# Patient Record
Sex: Male | Born: 1970 | Race: Black or African American | Hispanic: No | Marital: Married | Smoking: Never smoker
Health system: Southern US, Community
[De-identification: ages and names within clinical notes are randomized; demographics above are authoritative.]

## PROBLEM LIST (undated history)

## (undated) DIAGNOSIS — I10 Essential (primary) hypertension: Secondary | ICD-10-CM

## (undated) DIAGNOSIS — J329 Chronic sinusitis, unspecified: Secondary | ICD-10-CM

## (undated) DIAGNOSIS — I499 Cardiac arrhythmia, unspecified: Secondary | ICD-10-CM

## (undated) DIAGNOSIS — E785 Hyperlipidemia, unspecified: Secondary | ICD-10-CM

## (undated) HISTORY — PX: WISDOM TOOTH EXTRACTION: SHX21

## (undated) HISTORY — PX: PILONIDAL CYST EXCISION: SHX744

## (undated) HISTORY — DX: Essential (primary) hypertension: I10

---

## 2005-09-08 HISTORY — PX: PILONIDAL CYST EXCISION: SHX744

## 2006-01-05 ENCOUNTER — Emergency Department (HOSPITAL_COMMUNITY): Admission: EM | Admit: 2006-01-05 | Discharge: 2006-01-05 | Payer: Self-pay | Admitting: Family Medicine

## 2006-06-12 ENCOUNTER — Encounter (INDEPENDENT_AMBULATORY_CARE_PROVIDER_SITE_OTHER): Payer: Self-pay | Admitting: *Deleted

## 2006-06-12 ENCOUNTER — Ambulatory Visit (HOSPITAL_BASED_OUTPATIENT_CLINIC_OR_DEPARTMENT_OTHER): Admission: RE | Admit: 2006-06-12 | Discharge: 2006-06-12 | Payer: Self-pay | Admitting: Surgery

## 2008-04-12 ENCOUNTER — Emergency Department (HOSPITAL_COMMUNITY): Admission: EM | Admit: 2008-04-12 | Discharge: 2008-04-12 | Payer: Self-pay | Admitting: Family Medicine

## 2011-01-24 NOTE — Op Note (Signed)
NAMEMORDCHE, HEDGLIN             ACCOUNT NO.:  [id number]   MEDICAL RECORD NO.:  [id number]          PATIENT TYPE:  AMB   LOCATION:  DSC                          FACILITY:  [facility]   PHYSICIAN:  Sandria Bales. Ezzard Standing, M.D.  DATE OF BIRTH:  09/01/1971   DATE OF PROCEDURE:  06/12/2006  DATE OF DISCHARGE:                                 OPERATIVE REPORT   PREOPERATIVE DIAGNOSIS:  Pilonidal cyst/abscess.   POSTOPERATIVE DIAGNOSIS:  Pilonidal cyst/abscess.   PROCEDURE:  Excision of pilonidal cyst, left open.   SURGEON:  Sandria Bales. Ezzard Standing, M.D.   FIRST ASSISTANT:  None.   ANESTHESIA:  General, in a prone position with 30 cc of 0.25% Marcaine.   COMPLICATIONS:  None.   INDICATIONS FOR PROCEDURE:  Max Spencer is a 40 year old black male, pt of  Dr. Linton Flemings, who has had a pilonidal cyst over at least a couple of  years, and noticed this has kind of flared up and then gotten better, and  flared up and gotten better, and he now comes for excision of this cyst.   The indications and potential complications of the procedure were explained  to the patient.  Potential complications include bleeding, infection and the  possibility of recurrence of the cyst.   OPERATIVE NOTE:  The patient placed in a prone position.  After a general  endotracheal anesthetic, as supervised by Dr. Sampson Goon, in a prone  position, his buttocks were taped apart.  His buttocks were painted with  Betadine solution and sterilely draped.   He had 3 or 4 puncta in the midline between his intergluteal cleft.  And he  had a small, maybe 1 to 1.5-cm abscess off the left side of the very top of  this string of puncta.   I injected methylene blue into this small abscess, and this actually showed  that the tracking went to the midline, then went off to the left side again  lower down, about 3 to 4 cm.  I excised the entire tract, making it about a  6.5-cm incision, which was about 2 to 2.5 cm wide, and excised all of  the  pilonidal cyst.  I then used Bovie electrocautery to control bleeding.  I  infiltrated about 30 cc of 0.25% Marcaine.  I then put Betadine-soaked gauze  in the wound, and sterilely dressed it with 4 x 4's and an ABD.   The patient tolerated the procedure well.  In injected again about 30 cc of  0.25% Marcaine.  The sponge and needle counts were correct at the end of the  case.      Sandria Bales. Ezzard Standing, M.D.  Electronically Signed     DHN/MEDQ  D:  06/12/2006  T:  06/13/2006  Job:  [id number]   cc:   Olene Craven, M.D.

## 2012-11-28 ENCOUNTER — Emergency Department (HOSPITAL_COMMUNITY)
Admission: EM | Admit: 2012-11-28 | Discharge: 2012-11-28 | Discharge status: Absent without leave | Payer: Self-pay | Source: Home / Self Care

## 2012-11-28 ENCOUNTER — Emergency Department (HOSPITAL_COMMUNITY)
Admission: EM | Admit: 2012-11-28 | Discharge: 2012-11-28 | Disposition: A | Discharge status: Home / Self Care | Payer: 59 | Source: Home / Self Care

## 2012-11-28 ENCOUNTER — Encounter (HOSPITAL_COMMUNITY): Payer: Self-pay | Admitting: *Deleted

## 2012-11-28 DIAGNOSIS — J329 Chronic sinusitis, unspecified: Secondary | ICD-10-CM

## 2012-11-28 MED ORDER — AMOXICILLIN-POT CLAVULANATE 875-125 MG PO TABS: 1.0000 | ORAL_TABLET | Freq: Two times a day (BID) | ORAL | Status: DC

## 2012-11-28 NOTE — ED Notes (Signed)
Patient complains of sinus pressure and congestion x 1 month with yellow to green drainage.

## 2012-11-28 NOTE — ED Provider Notes (Signed)
Medical screening examination/treatment/procedure(s) were performed by non-physician practitioner and as supervising physician I was immediately available for consultation/collaboration.  Corrie Brannen   Paulena Servais, MD 11/28/12 1614 

## 2012-11-28 NOTE — ED Provider Notes (Signed)
Max Spencer is a 42 y.o. male who presents to Urgent Care today for sinus pain and discharge present for one month. Patient is left-sided sinus pain and discharge green and yellow. He's had off and on fevers and chills. He's tried multiple over-the-counter pain medications and decongestants with only marginal effect. He's had sinus infections in the past and this is consistent with those. He feels well otherwise with no chest pain palpitations or trouble breathing or current fever.    PMH reviewed. History of sinusitis. History of hyperlipidemia History  Substance Use Topics  . Smoking status: Never Smoker   . Smokeless tobacco: Not on file  . Alcohol Use: No   ROS as above Medications reviewed. No current facility-administered medications for this encounter.   Current Outpatient Prescriptions  Medication Sig Dispense Refill  . amoxicillin-clavulanate (AUGMENTIN) 875-125 MG per tablet Take 1 tablet by mouth 2 (two) times daily.  20 tablet  0    Exam:  BP 124/91  Pulse 75  Temp(Src) 99.2 F (37.3 C) (Oral)  Resp 16  SpO2 97% Gen: Well NAD HEENT: EOMI,  MMM, erythematous nasal turbinates with some discharge. Tender palpation left maxillary sinus. Lungs: CTABL Nl WOB Heart: RRR no MRG Abd: NABS, NT, ND Exts: Non edematous BL  LE, warm and well perfused.   No results found for this or any previous visit (from the past 24 hour(s)). No results found.  Assessment and Plan: 42 y.o. male with acute sinusitis.  Plan treatment with Augmentin.  Followup with primary care provider or ENT if not improved.  Discussed warning signs or symptoms. Please see discharge instructions. Patient expresses understanding.      Rodolph Bong, MD 11/28/12 [phone]

## 2012-12-27 ENCOUNTER — Encounter: Payer: Self-pay | Admitting: Cardiovascular Disease

## 2012-12-27 ENCOUNTER — Ambulatory Visit (INDEPENDENT_AMBULATORY_CARE_PROVIDER_SITE_OTHER): Payer: 59 | Admitting: Cardiovascular Disease

## 2012-12-27 VITALS — BP 164/108 | HR 78 | Ht 71.0 in | Wt 217.2 lb

## 2012-12-27 DIAGNOSIS — R002 Palpitations: Secondary | ICD-10-CM | POA: Insufficient documentation

## 2012-12-27 DIAGNOSIS — I10 Essential (primary) hypertension: Secondary | ICD-10-CM | POA: Insufficient documentation

## 2012-12-27 NOTE — Progress Notes (Signed)
HPI:  42 year old gentleman presenting for initial cardiac evaluation. 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 These have become more frequent over the last 6 weeks. He complains of increasing symptoms related to this. He feels these at nighttime when he is resting. He's had no exertional symptoms. He's had some sinus problems and has been taking Advil sinus. He completed a course of Augmentin. He is going to see Dr. Jearld Fenton with ENT in the near future. Since his sinus troubles began, he has noted more difficulty with sleep. He quit caffeine about 6 weeks ago but this has not improved his palpitations. He complains of associated lightheadedness and chest discomfort, both transient and noticed only when he feels a "skipped beat.". He continues to exercise on a sporadic basis. He can run a few miles without exertional symptoms.  He's noted borderline elevated blood pressures when he seen his primary physician, Dr. Conservation officer, historic buildings. He has not required antihypertensive treatment in the past. His blood pressure was noted to be markedly elevated today at 164/108. 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 I repeated his blood pressure and on my evaluation it was 150/90.  Outpatient Encounter Prescriptions as of 12/27/2012  Medication Sig Dispense Refill  . rosuvastatin (CRESTOR) 20 MG tablet Take 20 mg by mouth daily.      . [DISCONTINUED] amoxicillin-clavulanate (AUGMENTIN) 875-125 MG per tablet Take 1 tablet by mouth 2 (two) times daily.  20 tablet  0   No facility-administered encounter medications on file as of 12/27/2012.    Review of patient's allergies indicates no known allergies.  History reviewed. No pertinent past medical history.  History reviewed. No pertinent past surgical history.  History    Social History  . Marital Status: Single    Spouse Name: N/A    Number of Children: N/A  . Years of Education: N/A   Occupational History  . Not on file.   Social History Main Topics  . Smoking status: Never Smoker   . Smokeless tobacco: Not on file  . Alcohol Use: No  . Drug Use: No  . Sexually Active: Not Currently   Other Topics Concern  . Not on file   Social History Narrative  . No narrative on file   Family history: The patient's father died at age 68 of unclear cause. He had pneumonia a few weeks earlier and there was some mention of a myocardial infarction but again this is not definitive. His siblings have no cardiac problems.  ROS:  General: no fevers/chills/night sweats, positive for fatigue Eyes: no blurry vision, diplopia, or amaurosis ENT: no sore throat or hearing loss Resp: no cough, wheezing, or hemoptysis CV: See history of present illness GI: no abdominal pain, nausea, vomiting, diarrhea, or constipation GU: no dysuria, frequency, or hematuria Skin: no rash Neuro: no headache, numbness, tingling, or weakness of extremities Musculoskeletal: no joint pain or swelling Heme: no bleeding, DVT, or easy bruising Endo: no polydipsia or polyuria  BP 164/108  Pulse 78  Ht 5\' 11"  (1.803 m)  Wt 98.521 kg (217 lb 3.2 oz)  BMI 30.31 kg/m2  SpO2 99%  PHYSICAL EXAM: Pt is alert and oriented, WD, WN, in no distress. HEENT: normal Neck: JVP normal. Carotid upstrokes normal without bruits. No thyromegaly. Lungs: equal expansion, clear  bilaterally CV: Apex is discrete and nondisplaced, RRR without murmur or gallop Abd: soft, NT, +BS, no bruit, no hepatosplenomegaly Back: no CVA tenderness Ext: no C/C/E        Femoral pulses 2+= without bruits        DP/PT pulses intact and = Skin: warm and dry without rash Neuro: CNII-XII intact             Strength intact = bilaterally  EKG:  Sinus rhythm 68 beats per minute, rare PVC.  ASSESSMENT AND PLAN: 1.  Symptomatic PVCs. His physical exam is unremarkable. He has modified his lifestyle with avoidance of caffeine without significant improvement. I have recommended an exercise treadmill study and an echocardiogram to evaluate for any exercise-induced arrhythmia, ischemic disease, or structural heart disease. As long as these studies are normal, could consider addition of a beta blocker. I advised him to avoid cold medicine because of pseudoephedrine likely exacerbating his high blood pressure. I have asked him to record blood pressures and bring the readings in when he sees me back next month. I will followup with him after his echo and treadmill study are completed. Depending on those findings along with his blood his blood pressure readings, will consider a low-dose beta blocker.  2. Elevated Blood Pressure. He will keep a blood pressure log and bring it in next month. As above he will avoid cold medication or anything containing pseudoephedrine. Will review when he returns next month.  Tonny Bollman 12/27/2012 5:55 PM

## 2012-12-27 NOTE — Patient Instructions (Signed)
Your physician has requested that you have an echocardiogram. Echocardiography is a painless test that uses sound waves to create images of your heart. It provides your doctor with information about the size and shape of your heart and how well your heart's chambers and valves are working. This procedure takes approximately one hour. There are no restrictions for this procedure.  Your physician has requested that you have an exercise tolerance test with PA/NP. For further information please visit [email or web address] Please also follow instruction sheet, as given.  Your physician recommends that you schedule a follow-up appointment in: 4 WEEKS with Dr Excell Seltzer  Your physician recommends that you continue on your current medications as directed. Please refer to the Current Medication list given to you today.

## 2013-01-04 ENCOUNTER — Ambulatory Visit (HOSPITAL_COMMUNITY): Payer: 59 | Attending: Cardiology | Admitting: Radiology

## 2013-01-04 DIAGNOSIS — R002 Palpitations: Secondary | ICD-10-CM | POA: Insufficient documentation

## 2013-01-04 NOTE — Progress Notes (Signed)
Echocardiogram performed.  

## 2013-01-05 ENCOUNTER — Other Ambulatory Visit: Payer: Self-pay | Admitting: *Deleted

## 2013-01-05 MED ORDER — AMLODIPINE BESYLATE 5 MG PO TABS: 5.0000 mg | ORAL_TABLET | Freq: Every day | ORAL | Status: DC

## 2013-01-05 MED ORDER — ROSUVASTATIN CALCIUM 20 MG PO TABS: 20.0000 mg | ORAL_TABLET | Freq: Every day | ORAL | Status: DC

## 2013-01-07 NOTE — Progress Notes (Signed)
This encounter was created in error - please disregard.

## 2013-01-12 ENCOUNTER — Encounter: Payer: 59 | Admitting: Physician Assistant

## 2013-01-28 ENCOUNTER — Ambulatory Visit: Payer: 59 | Admitting: Cardiovascular Disease

## 2013-02-16 ENCOUNTER — Ambulatory Visit (INDEPENDENT_AMBULATORY_CARE_PROVIDER_SITE_OTHER): Payer: 59 | Admitting: Physician Assistant

## 2013-02-16 ENCOUNTER — Encounter: Payer: Self-pay | Admitting: Cardiovascular Disease

## 2013-02-16 DIAGNOSIS — R079 Chest pain, unspecified: Secondary | ICD-10-CM

## 2013-02-16 NOTE — Progress Notes (Signed)
Exercise Treadmill Test  Pre-Exercise Testing Evaluation Rhythm: normal sinus  Rate: 67     Test  Exercise Tolerance Test Ordering MD: Tonny Bollman, MD  Interpreting MD: Jacolyn Reedy, PA-C  Unique Test No: 1  Treadmill:  1  Indication for ETT: chest pain - rule out ischemia  Contraindication to ETT: No   Stress Modality: exercise - treadmill  Cardiac Imaging Performed: non   Protocol: standard Bruce - maximal  Max BP:  185/73  Max MPHR (bpm): 179 85% MPR (bpm):  152  MPHR obtained (bpm):  176 % MPHR obtained:  98%  Reached 85% MPHR (min:sec):  11:00 Total Exercise Time (min-sec):  13:24  Workload in METS:  16.1 Borg Scale: 17  Reason ETT Terminated:  fatigue    ST Segment Analysis At Rest: normal ST segments - no evidence of significant ST depression With Exercise: no evidence of significant ST depression  Other Information Arrhythmia:  No Angina during ETT:  absent (0) Quality of ETT:  diagnostic  ETT Interpretation:  normal - no evidence of ischemia by ST analysis  Comments: Good exercise tolerance. Hypertensive response to exercise   Recommendations: F/u Dr. Excell Seltzer

## 2013-05-26 ENCOUNTER — Encounter (HOSPITAL_BASED_OUTPATIENT_CLINIC_OR_DEPARTMENT_OTHER)
Admission: RE | Admit: 2013-05-26 | Discharge: 2013-05-26 | Disposition: A | Discharge status: Home / Self Care | Payer: 59 | Source: Ambulatory Visit

## 2013-05-26 ENCOUNTER — Encounter (HOSPITAL_BASED_OUTPATIENT_CLINIC_OR_DEPARTMENT_OTHER): Payer: Self-pay | Admitting: *Deleted

## 2013-05-26 LAB — BASIC METABOLIC PANEL
CO2: 27 mEq/L (ref 19–32)
Calcium: 9.8 mg/dL (ref 8.4–10.5)
Creatinine, Ser: 1.06 mg/dL (ref 0.50–1.35)
Glucose, Bld: 89 mg/dL (ref 70–99)

## 2013-05-26 NOTE — Progress Notes (Signed)
Pt saw [facility] 4/14 for palpitations-stress and echo done-nl-did have pvc-he was started on htn meds and taken off sudafed caffiene Had ekg-to come in for bmet-all those records under another [id number]

## 2013-05-27 ENCOUNTER — Encounter (HOSPITAL_BASED_OUTPATIENT_CLINIC_OR_DEPARTMENT_OTHER): Payer: Self-pay | Admitting: Anesthesiology

## 2013-05-27 ENCOUNTER — Encounter (HOSPITAL_BASED_OUTPATIENT_CLINIC_OR_DEPARTMENT_OTHER): Payer: Self-pay | Admitting: *Deleted

## 2013-05-27 ENCOUNTER — Ambulatory Visit (HOSPITAL_BASED_OUTPATIENT_CLINIC_OR_DEPARTMENT_OTHER): Payer: 59 | Admitting: Anesthesiology

## 2013-05-27 ENCOUNTER — Encounter (HOSPITAL_BASED_OUTPATIENT_CLINIC_OR_DEPARTMENT_OTHER)
Admission: RE | Disposition: A | Discharge status: Home / Self Care | Payer: Self-pay | Source: Ambulatory Visit | Attending: Otolaryngology

## 2013-05-27 ENCOUNTER — Ambulatory Visit (HOSPITAL_BASED_OUTPATIENT_CLINIC_OR_DEPARTMENT_OTHER)
Admission: RE | Admit: 2013-05-27 | Discharge: 2013-05-27 | Disposition: A | Discharge status: Home / Self Care | Payer: 59 | Source: Ambulatory Visit | Attending: Otolaryngology | Admitting: Otolaryngology

## 2013-05-27 DIAGNOSIS — E785 Hyperlipidemia, unspecified: Secondary | ICD-10-CM | POA: Insufficient documentation

## 2013-05-27 DIAGNOSIS — Z01812 Encounter for preprocedural laboratory examination: Secondary | ICD-10-CM | POA: Insufficient documentation

## 2013-05-27 DIAGNOSIS — I10 Essential (primary) hypertension: Secondary | ICD-10-CM | POA: Insufficient documentation

## 2013-05-27 DIAGNOSIS — J329 Chronic sinusitis, unspecified: Secondary | ICD-10-CM | POA: Insufficient documentation

## 2013-05-27 HISTORY — DX: Chronic sinusitis, unspecified: J32.9

## 2013-05-27 HISTORY — DX: Cardiac arrhythmia, unspecified: I49.9

## 2013-05-27 HISTORY — DX: Essential (primary) hypertension: I10

## 2013-05-27 HISTORY — DX: Hyperlipidemia, unspecified: E78.5

## 2013-05-27 HISTORY — PX: SINUS ENDO W/FUSION: SHX777

## 2013-05-27 LAB — POCT HEMOGLOBIN-HEMACUE: Hemoglobin: 16 g/dL (ref 13.0–17.0)

## 2013-05-27 SURGERY — SINUS SURGERY, ENDOSCOPIC, USING COMPUTER-ASSISTED NAVIGATION
Anesthesia: General | Site: Nose | Wound class: Clean Contaminated

## 2013-05-27 MED ORDER — PROPOFOL 10 MG/ML IV BOLUS
INTRAVENOUS | Status: DC | PRN
  Administered 2013-05-27: 200 mg via INTRAVENOUS

## 2013-05-27 MED ORDER — OXYCODONE HCL 5 MG PO TABS: 5.0000 mg | ORAL_TABLET | Freq: Once | ORAL | Status: DC | PRN

## 2013-05-27 MED ORDER — GLYCOPYRROLATE 0.2 MG/ML IJ SOLN
INTRAMUSCULAR | Status: DC | PRN
  Administered 2013-05-27: .6 mg via INTRAVENOUS

## 2013-05-27 MED ORDER — ROCURONIUM BROMIDE 100 MG/10ML IV SOLN
INTRAVENOUS | Status: DC | PRN
  Administered 2013-05-27: 40 mg via INTRAVENOUS

## 2013-05-27 MED ORDER — ONDANSETRON HCL 4 MG/2ML IJ SOLN
INTRAMUSCULAR | Status: DC | PRN
  Administered 2013-05-27: 4 mg via INTRAVENOUS

## 2013-05-27 MED ORDER — FENTANYL CITRATE 0.05 MG/ML IJ SOLN
INTRAMUSCULAR | Status: DC | PRN
  Administered 2013-05-27: 50 ug via INTRAVENOUS
  Administered 2013-05-27: 100 ug via INTRAVENOUS
  Administered 2013-05-27: 50 ug via INTRAVENOUS

## 2013-05-27 MED ORDER — LIDOCAINE-EPINEPHRINE 1 %-1:100000 IJ SOLN
INTRAMUSCULAR | Status: DC | PRN
  Administered 2013-05-27: 2.5 mL

## 2013-05-27 MED ORDER — MIDAZOLAM HCL 5 MG/5ML IJ SOLN
INTRAMUSCULAR | Status: DC | PRN
  Administered 2013-05-27: 2 mg via INTRAVENOUS

## 2013-05-27 MED ORDER — HYDROCODONE-ACETAMINOPHEN 7.5-325 MG/15ML PO SOLN: 15.0000 mL | Freq: Four times a day (QID) | ORAL | Status: DC | PRN

## 2013-05-27 MED ORDER — LACTATED RINGERS IV SOLN
INTRAVENOUS | Status: DC
  Administered 2013-05-27 (×2): via INTRAVENOUS

## 2013-05-27 MED ORDER — MIDAZOLAM HCL 2 MG/2ML IJ SOLN: 1.0000 mg | INTRAMUSCULAR | Status: DC | PRN

## 2013-05-27 MED ORDER — OXYCODONE HCL 5 MG/5ML PO SOLN: 5.0000 mg | Freq: Once | ORAL | Status: DC | PRN

## 2013-05-27 MED ORDER — ONDANSETRON HCL 4 MG/2ML IJ SOLN: 4.0000 mg | Freq: Once | INTRAMUSCULAR | Status: DC | PRN

## 2013-05-27 MED ORDER — BACITRACIN ZINC 500 UNIT/GM EX OINT
TOPICAL_OINTMENT | CUTANEOUS | Status: DC | PRN
  Administered 2013-05-27: 1 via TOPICAL

## 2013-05-27 MED ORDER — LIDOCAINE HCL (CARDIAC) 20 MG/ML IV SOLN
INTRAVENOUS | Status: DC | PRN
  Administered 2013-05-27: 100 mg via INTRAVENOUS

## 2013-05-27 MED ORDER — NEOSTIGMINE METHYLSULFATE 1 MG/ML IJ SOLN
INTRAMUSCULAR | Status: DC | PRN
  Administered 2013-05-27: 4 mg via INTRAVENOUS

## 2013-05-27 MED ORDER — FENTANYL CITRATE 0.05 MG/ML IJ SOLN: 50.0000 ug | INTRAMUSCULAR | Status: DC | PRN

## 2013-05-27 MED ORDER — CEPHALEXIN 500 MG PO CAPS: 500.0000 mg | ORAL_CAPSULE | Freq: Three times a day (TID) | ORAL | Status: DC

## 2013-05-27 MED ORDER — DEXAMETHASONE SODIUM PHOSPHATE 4 MG/ML IJ SOLN
INTRAMUSCULAR | Status: DC | PRN
  Administered 2013-05-27: 10 mg via INTRAVENOUS

## 2013-05-27 MED ORDER — HYDROMORPHONE HCL PF 1 MG/ML IJ SOLN
0.2500 mg | INTRAMUSCULAR | Status: DC | PRN
  Administered 2013-05-27: 0.5 mg via INTRAVENOUS

## 2013-05-27 MED ORDER — OXYMETAZOLINE HCL 0.05 % NA SOLN
NASAL | Status: DC | PRN
  Administered 2013-05-27: 1 via NASAL

## 2013-05-27 SURGICAL SUPPLY — 54 items
ATTRACTOMAT 16X20 MAGNETIC DRP (DRAPES) IMPLANT
BLADE RAD40 ROTATE 4M 4 5PK (BLADE) IMPLANT
BLADE RAD60 ROTATE M4 4 5PK (BLADE) IMPLANT
BLADE ROTATE RAD 12 4 M4 (BLADE) ×1 IMPLANT
BLADE ROTATE RAD 40 4 M4 (BLADE) ×2 IMPLANT
BLADE ROTATE RAD12 5PK M4 4MM (BLADE) IMPLANT
BLADE ROTATE TRICUT 4X13 M4 (BLADE) ×2 IMPLANT
BLADE TRICUT ROTATE M4 4 5PK (BLADE) IMPLANT
BUR HS RAD FRONTAL 3 (BURR) IMPLANT
CANISTER SUC SOCK COL 7 IN (MISCELLANEOUS) ×4 IMPLANT
CANISTER SUCTION 1200CC (MISCELLANEOUS) ×3 IMPLANT
CLOTH BEACON ORANGE TIMEOUT ST (SAFETY) ×2 IMPLANT
COAGULATOR SUCT SWTCH 10FR 6 (ELECTROSURGICAL) ×1 IMPLANT
DECANTER SPIKE VIAL GLASS SM (MISCELLANEOUS) ×1 IMPLANT
DRAPE SURG 17X23 STRL (DRAPES) IMPLANT
DRESSING NASAL KENNEDY 3.5X.9 (MISCELLANEOUS) IMPLANT
DRSG NASAL KENNEDY 3.5X.9 (MISCELLANEOUS)
DRSG NASOPORE 8CM (GAUZE/BANDAGES/DRESSINGS) ×1 IMPLANT
DRSG TELFA 3X8 NADH (GAUZE/BANDAGES/DRESSINGS) IMPLANT
ELECT COATED BLADE 2.86 ST (ELECTRODE) IMPLANT
ELECT REM PT RETURN 9FT ADLT (ELECTROSURGICAL) ×2
ELECTRODE REM PT RTRN 9FT ADLT (ELECTROSURGICAL) IMPLANT
GLOVE EXAM NITRILE MD LF STRL (GLOVE) ×1 IMPLANT
GLOVE SS BIOGEL STRL SZ 7.5 (GLOVE) ×1 IMPLANT
GLOVE SUPERSENSE BIOGEL SZ 7.5 (GLOVE) ×1
GLOVE SURG SS PI 7.0 STRL IVOR (GLOVE) ×1 IMPLANT
GOWN PREVENTION PLUS XLARGE (GOWN DISPOSABLE) ×2 IMPLANT
GOWN PREVENTION PLUS XXLARGE (GOWN DISPOSABLE) ×1 IMPLANT
IV NS 1000ML (IV SOLUTION)
IV NS 1000ML BAXH (IV SOLUTION) IMPLANT
IV NS 500ML (IV SOLUTION) ×2
IV NS 500ML BAXH (IV SOLUTION) IMPLANT
NDL SPNL 25GX3.5 QUINCKE BL (NEEDLE) IMPLANT
NEEDLE 27GAX1X1/2 (NEEDLE) ×2 IMPLANT
NEEDLE SPNL 25GX3.5 QUINCKE BL (NEEDLE) IMPLANT
NS IRRIG 1000ML POUR BTL (IV SOLUTION) ×1 IMPLANT
PACK BASIN DAY SURGERY FS (CUSTOM PROCEDURE TRAY) ×2 IMPLANT
PACK ENT DAY SURGERY (CUSTOM PROCEDURE TRAY) ×2 IMPLANT
PAD DRESSING TELFA 3X8 NADH (GAUZE/BANDAGES/DRESSINGS) IMPLANT
PAD ENT ADHESIVE 25PK (MISCELLANEOUS) ×2 IMPLANT
PATTIES SURGICAL .5 X3 (DISPOSABLE) ×2 IMPLANT
PENCIL FOOT CONTROL (ELECTRODE) IMPLANT
SOLUTION ANTI FOG 6CC (MISCELLANEOUS) ×2 IMPLANT
SPONGE GAUZE 2X2 8PLY STRL LF (GAUZE/BANDAGES/DRESSINGS) ×2 IMPLANT
SPONGE SURGIFOAM ABS GEL 12-7 (HEMOSTASIS) IMPLANT
SUT CHROMIC 3 0 PS 2 (SUTURE) IMPLANT
SUT ETHILON 3 0 PS 1 (SUTURE) IMPLANT
TOWEL OR 17X24 6PK STRL BLUE (TOWEL DISPOSABLE) ×2 IMPLANT
TRACKER ENT INSTRUMENT (MISCELLANEOUS) ×2 IMPLANT
TRACKER ENT PATIENT (MISCELLANEOUS) ×2 IMPLANT
TRAY DSU PREP LF (CUSTOM PROCEDURE TRAY) ×2 IMPLANT
TUBE CONNECTING 20X1/4 (TUBING) ×1 IMPLANT
TUBING STRAIGHTSHOT EPS 5PK (TUBING) ×2 IMPLANT
YANKAUER SUCT BULB TIP NO VENT (SUCTIONS) ×2 IMPLANT

## 2013-05-27 NOTE — Transfer of Care (Signed)
Immediate Anesthesia Transfer of Care Note  Patient: Max Spencer  Procedure(s) Performed: Procedure(s): ENDOSCOPIC SINUS SURGERY WITH FUSION NAVIGATION (N/A)  Patient Location: PACU  Anesthesia Type:General  Level of Consciousness: awake  Airway & Oxygen Therapy: Patient Spontanous Breathing and Patient connected to face mask oxygen  Post-op Assessment: Report given to PACU RN and Post -op Vital signs reviewed and stable  Post vital signs: Reviewed and stable  Complications: No apparent anesthesia complications

## 2013-05-27 NOTE — Anesthesia Postprocedure Evaluation (Signed)
  Anesthesia Post-op Note  Patient: Max Spencer  Procedure(s) Performed: Procedure(s): ENDOSCOPIC SINUS SURGERY WITH FUSION NAVIGATION (N/A)  Patient Location: PACU  Anesthesia Type:General  Level of Consciousness: awake, alert  and oriented  Airway and Oxygen Therapy: Patient Spontanous Breathing and Patient connected to face mask oxygen  Post-op Pain: mild  Post-op Assessment: Post-op Vital signs reviewed  Post-op Vital Signs: Reviewed  Complications: No apparent anesthesia complications

## 2013-05-27 NOTE — Op Note (Signed)
Preop/postop diagnosis: Chronic sinusitis Procedure: Bilateral maxillary antrostomy with left stripping, total ethmoidectomy, bilateral frontal sinusotomy, Medtronics fusion computer guidance, nasal polypectomy Anesthesia: Gen. Estimated blood loss: Approximately 50 cc Indications 42 year old who's had chronic sinus disease that has failed medical therapy. CT scan shows significant mucosal thickening and polypoid material throughout his sinuses bilaterally. He was informed a risk and benefits of the procedure and options were discussed all questions are answered and consent was obtained. Operation: Patient was taken to the operating room placed in the supine position after general endotracheal tube anesthesia was placed in the supine position prepped and draped in the usual sterile manner. The Medtronic system was positioned calibrated with excellent accuracy. The oxymetazoline pledgets were placed into the nose bilaterally and then the inferior and middle turbinates were injected with 1% lidocaine with 1 100,000 epinephrine. A left side was begun using the microdebrider and fusion guidance the antrostomy was opened after removing the uncinate process up to the attachment of the middle turbinate. The maxillary sinus ostia was opened widely and there was a fairly thick mucus that was removed and required microdebrider inferiorly. There was polypoid material in the maxillary sinus that was removed the microdebrider. The ethmoid was then opened open the bulla dissection was carried from posterior to anterior with polypoid material throughout the ethmoid cavity. He uses performed with a microdebrider and both the straight and 40. The nasal frontal duct was then opened and there was thick mucus within the frontal sinus. This also done with fusion guidance. This opened up all the sinuses except for the sphenoid well which did not have disease on CT scan. Pledgets were placed. The right side was repeated in same  fashion again with similar findings except not the significant thickened mucous and polyps within the maxillary sinus. Ethmoids had polypoid material throughout. The frontal sinus and mucous and opened nicely. Pledgets were placed. The patient had polypoid degeneration on the posterior aspect of the inferior turbinates  that was actually obstructing a portion of the nasopharynx and this was microdebrider and off and then the suction cautery used to cauterize the base. Nasopor soaked in bacitracin was placed into both ethmoid cavities and nasopharynx suctioned out of all blood and debris. Patient was then awakened brought to cover stable condition counts correct

## 2013-05-27 NOTE — H&P (Signed)
Max Spencer is an 42 y.o. male.   Chief Complaint: sinusitis HPI: Hx of chronic sinusitis with failure with medical therapy.   Past Medical History  Diagnosis Date  . Chronic sinus infection   . 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 Hypertension   . Hyperlipidemia     Past Surgical History  Procedure Laterality Date  . Pilonidal cyst excision  2007  . Wisdom tooth extraction      History reviewed. No pertinent family history. Social History:  reports that he has never smoked. He does not have any smokeless tobacco history on file. He reports that he does not drink alcohol or use illicit drugs.  Allergies:  Allergies  Allergen Reactions  . Tetracyclines & Related     vertigo    Medications Prior to Admission  Medication Sig Dispense Refill  . amLODipine (NORVASC) 5 MG tablet Take 5 mg by mouth daily.      . rosuvastatin (CRESTOR) 10 MG tablet Take 10 mg by mouth daily.        Results for orders placed during the hospital encounter of 05/27/13 (from the past 48 hour(s))  BASIC METABOLIC PANEL     Status: Abnormal   Collection Time    05/26/13  2:15 PM      Result Value Range   Sodium 135  135 - 145 mEq/L   Potassium 3.9  3.5 - 5.1 mEq/L   Chloride 98  96 - 112 mEq/L   CO2 27  19 - 32 mEq/L   Glucose, Bld 89  70 - 99 mg/dL   BUN 12  6 - 23 mg/dL   Creatinine, Ser 1.61  0.50 - 1.35 mg/dL   Calcium 9.8  8.4 - 09.6 mg/dL   GFR calc non Af Amer 86 (*) >90 mL/min   GFR calc Af Amer >90  >90 mL/min   Comment: (NOTE)     The eGFR has been calculated using the CKD EPI equation.     This calculation has not been validated in all clinical situations.     eGFR's persistently <90 mL/min signify possible Chronic Kidney     Disease.   No results found.  Review of Systems  Constitutional: Negative.   HENT: Negative.   Eyes: Negative.   Respiratory: Negative.   Cardiovascular: Negative.   Skin: Negative.     Blood pressure 143/88, pulse 65, temperature  98.4 F (36.9 C), temperature source Oral, resp. rate 16, height 5\' 11"  (1.803 m), weight 98.431 kg (217 lb), SpO2 97.00%. Physical Exam  Constitutional: He appears well-nourished.  HENT:  Mouth/Throat: Oropharynx is clear and moist.  Eyes: Pupils are equal, round, and reactive to light.  Neck: Normal range of motion. Neck supple.  Cardiovascular: Normal rate.   Respiratory: Effort normal.  GI: Soft.     Assessment/Plan Chronic Sinusitis- he is ready for ESS and procedure discussed  Suzanna Obey 05/27/2013, 9:36 AM

## 2013-05-27 NOTE — Anesthesia Preprocedure Evaluation (Signed)
Anesthesia Evaluation  Patient identified by MRN, date of birth, ID band Patient awake    Reviewed: Allergy & Precautions, H&P , NPO status , Patient's Chart, lab work & pertinent test results  Airway Mallampati: I      Dental  (+) Teeth Intact and Dental Advisory Given   Pulmonary  breath sounds clear to auscultation        Cardiovascular hypertension, Pt. on medications Rhythm:Regular Rate:Normal     Neuro/Psych    GI/Hepatic   Endo/Other    Renal/GU      Musculoskeletal   Abdominal   Peds  Hematology   Anesthesia Other Findings   Reproductive/Obstetrics                           Anesthesia Physical Anesthesia Plan  ASA: II  Anesthesia Plan: General   Post-op Pain Management:    Induction: Intravenous  Airway Management Planned: Oral ETT  Additional Equipment:   Intra-op Plan:   Post-operative Plan: Extubation in OR  Informed Consent: I have reviewed the patients History and Physical, chart, labs and discussed the procedure including the risks, benefits and alternatives for the proposed anesthesia with the patient or authorized representative who has indicated his/her understanding and acceptance.   Dental advisory given  Plan Discussed with: CRNA, Anesthesiologist and Surgeon  Anesthesia Plan Comments:         Anesthesia Quick Evaluation

## 2013-05-30 ENCOUNTER — Encounter (HOSPITAL_BASED_OUTPATIENT_CLINIC_OR_DEPARTMENT_OTHER): Payer: Self-pay | Admitting: Otolaryngology

## 2013-05-30 ENCOUNTER — Encounter: Payer: Self-pay | Admitting: Cardiovascular Disease

## 2013-06-09 ENCOUNTER — Telehealth (HOSPITAL_COMMUNITY): Payer: Self-pay | Admitting: Cardiovascular Disease

## 2013-06-09 NOTE — Telephone Encounter (Signed)
Spoke with patient today to follow-up on blood pressure control. He is on amlodipine 5 mg with SBP generally over 140 mm Hg. Recommend increase amlodipine to 10 mg daily.  Tonny Bollman 06/09/2013 6:24 PM

## 2013-06-13 MED ORDER — AMLODIPINE BESYLATE 10 MG PO TABS: 10.0000 mg | ORAL_TABLET | Freq: Every day | ORAL | Status: DC

## 2013-06-13 NOTE — Telephone Encounter (Signed)
Rx sent to pharmacy   

## 2014-02-09 ENCOUNTER — Other Ambulatory Visit: Payer: Self-pay | Admitting: Cardiovascular Disease

## 2014-03-21 ENCOUNTER — Ambulatory Visit: Payer: 59 | Admitting: Cardiovascular Disease

## 2014-04-04 NOTE — Progress Notes (Signed)
   Cardiology Office Note    Date:  04/05/2014   ID:  Max Spencer, DOB 1971/07/03, MRN [id number]  PCP:  Katy ApoPOLITE,RONALD D, MD  Cardiologist:  Dr. Tonny BollmanMichael Cooper      History of Present Illness: Max Spencer is a 43 y.o. male with a hx of palpitations 2/2 PVCs, HTN.  Last seen by Dr. Tonny BollmanMichael Cooper in 12/2012.  He returns for follow up.  The patient denies chest pain, shortness of breath, syncope, orthopnea, PND or significant pedal edema.    Studies:  - Echo (4/14):  Mild LVH, EF 65%, no RWMA, normal diast fxn  - ETT (6/14):  Normal    Recent Labs: 05/26/2013: Creatinine 1.06; Potassium 3.9  05/27/2013: Hemoglobin 16.0   Wt Readings from Last 3 Encounters:  04/05/14 211 lb (95.709 kg)  05/27/13 217 lb (98.431 kg)  05/27/13 217 lb (98.431 kg)     Past Medical History  Diagnosis Date  . High blood pressure   . Chronic sinus infection   . 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 Hypertension   . Hyperlipidemia     Current Outpatient Prescriptions  Medication Sig Dispense Refill  . amLODipine (NORVASC) 10 MG tablet Take 1 tablet (10 mg total) by mouth daily.  90 tablet  3  . aspirin 81 MG tablet Take 81 mg by mouth daily.      . CRESTOR 20 MG tablet TAKE 1 TABLET BY MOUTH DAILY.  30 tablet  0   No current facility-administered medications for this visit.    Allergies:   Tetracyclines & related   Social History:  The patient  reports that he has never smoked. He does not have any smokeless tobacco history on file. He reports that he does not drink alcohol or use illicit drugs.   Family History:  The patient's family history includes Cancer in his maternal grandmother; Diabetes in his maternal grandfather; Hyperlipidemia in his father, maternal grandfather, and mother; Hypertension in his maternal grandmother; Sudden death in his father.   ROS:  Please see the history of present illness.      All other systems reviewed and negative.   PHYSICAL EXAM: VS:   BP 122/80  Pulse 55  Ht 5\' 11"  (1.803 m)  Wt 211 lb (95.709 kg)  BMI 29.44 kg/m2 Well nourished, well developed, in no acute distress HEENT: normal Neck: no JVD Cardiac:  normal S1, S2; RRR; no murmur Lungs:  clear to auscultation bilaterally, no wheezing, rhonchi or rales Abd: soft, nontender, no hepatomegaly Ext: no edema Skin: warm and dry Neuro:  CNs 2-12 intact, no focal abnormalities noted  EKG:  Sinus brady, HR 55, normal axis, no ST changes  ASSESSMENT AND PLAN:  Essential hypertension:  Controlled.  Continue current Rx.  Check CMET.  Heart palpitations:  Controlled.   Other and unspecified hyperlipidemia:  Check CMET, Lipids.  Continue statin.   Disposition:  F/u Dr. Tonny BollmanMichael Cooper in 1 year.    Signed, Brynda RimScott Josefina Rynders, PA-C, MHS 04/05/2014 10:32 AM    [facility] [address], [place], [place]  [address] Phone: [phone]) [phone]; Fax: [phone]

## 2014-04-05 ENCOUNTER — Encounter: Payer: Self-pay | Admitting: Physician Assistant

## 2014-04-05 ENCOUNTER — Ambulatory Visit (INDEPENDENT_AMBULATORY_CARE_PROVIDER_SITE_OTHER): Payer: 59 | Admitting: Physician Assistant

## 2014-04-05 VITALS — BP 122/80 | HR 55 | Ht 71.0 in | Wt 211.0 lb

## 2014-04-05 DIAGNOSIS — I10 Essential (primary) hypertension: Secondary | ICD-10-CM

## 2014-04-05 DIAGNOSIS — E785 Hyperlipidemia, unspecified: Secondary | ICD-10-CM

## 2014-04-05 DIAGNOSIS — R002 Palpitations: Secondary | ICD-10-CM

## 2014-04-05 LAB — LIPID PANEL
CHOLESTEROL: 177 mg/dL (ref 0–200)
HDL: 36.9 mg/dL — ABNORMAL LOW (ref >39.00)
NonHDL: 140.1
Total CHOL/HDL Ratio: 5
Triglycerides: 429 mg/dL — ABNORMAL HIGH (ref 0.0–149.0)
VLDL: 85.8 mg/dL — ABNORMAL HIGH (ref 0.0–40.0)

## 2014-04-05 LAB — COMPREHENSIVE METABOLIC PANEL
ALT: 23 U/L (ref 0–53)
AST: 23 U/L (ref 0–37)
Albumin: 4.2 g/dL (ref 3.5–5.2)
Alkaline Phosphatase: 70 U/L (ref 39–117)
BILIRUBIN TOTAL: 0.5 mg/dL (ref 0.2–1.2)
BUN: 12 mg/dL (ref 6–23)
CALCIUM: 9.5 mg/dL (ref 8.4–10.5)
CHLORIDE: 103 meq/L (ref 96–112)
CO2: 29 meq/L (ref 19–32)
CREATININE: 1.2 mg/dL (ref 0.4–1.5)
GFR: 84.49 mL/min (ref >60.00)
Glucose, Bld: 106 mg/dL — ABNORMAL HIGH (ref 70–99)
Potassium: 4 mEq/L (ref 3.5–5.1)
SODIUM: 138 meq/L (ref 135–145)
TOTAL PROTEIN: 7.3 g/dL (ref 6.0–8.3)

## 2014-04-05 LAB — LDL CHOLESTEROL, DIRECT: LDL DIRECT: 49 mg/dL

## 2014-04-05 MED ORDER — ROSUVASTATIN CALCIUM 20 MG PO TABS: 20.0000 mg | ORAL_TABLET | Freq: Every day | ORAL | Status: DC

## 2014-04-05 MED ORDER — AMLODIPINE BESYLATE 10 MG PO TABS: 10.0000 mg | ORAL_TABLET | Freq: Every day | ORAL | Status: DC

## 2014-04-05 NOTE — Patient Instructions (Addendum)
LAB WORK TODAY  Your physician wants you to follow-up in: 1 YEAR WITH DR. Excell SeltzerOOPER. You will receive a reminder letter in the mail two months in advance. If you don't receive a letter, please call our office to schedule the follow-up appointment.

## 2014-04-06 ENCOUNTER — Telehealth: Payer: Self-pay | Admitting: *Deleted

## 2014-04-06 DIAGNOSIS — I10 Essential (primary) hypertension: Secondary | ICD-10-CM

## 2014-04-06 DIAGNOSIS — E785 Hyperlipidemia, unspecified: Secondary | ICD-10-CM

## 2014-04-06 MED ORDER — OMEGA-3-ACID ETHYL ESTERS 1 G PO CAPS: 1.0000 g | ORAL_CAPSULE | Freq: Two times a day (BID) | ORAL | Status: DC

## 2014-04-06 NOTE — Telephone Encounter (Signed)
pt notiifed about lab results and to start lovaza due to trigs 429. FLP/LFT 10/30 pt aware of repeat lab appt date. [facility]

## 2014-07-07 ENCOUNTER — Other Ambulatory Visit: Payer: 59

## 2014-08-15 ENCOUNTER — Other Ambulatory Visit (INDEPENDENT_AMBULATORY_CARE_PROVIDER_SITE_OTHER): Payer: Managed Care, Other (non HMO) | Admitting: *Deleted

## 2014-08-15 DIAGNOSIS — E785 Hyperlipidemia, unspecified: Secondary | ICD-10-CM

## 2014-08-15 DIAGNOSIS — I10 Essential (primary) hypertension: Secondary | ICD-10-CM

## 2014-08-15 LAB — HEPATIC FUNCTION PANEL
ALBUMIN: 4.2 g/dL (ref 3.5–5.2)
ALT: 23 U/L (ref 0–53)
AST: 31 U/L (ref 0–37)
Alkaline Phosphatase: 65 U/L (ref 39–117)
Bilirubin, Direct: 0 mg/dL (ref 0.0–0.3)
TOTAL PROTEIN: 7.3 g/dL (ref 6.0–8.3)
Total Bilirubin: 0.6 mg/dL (ref 0.2–1.2)

## 2014-08-15 LAB — LDL CHOLESTEROL, DIRECT: LDL DIRECT: 96.4 mg/dL

## 2014-08-15 LAB — LIPID PANEL
CHOL/HDL RATIO: 6
CHOLESTEROL: 218 mg/dL — AB (ref 0–200)
HDL: 38.8 mg/dL — AB (ref >39.00)
NonHDL: 179.2
Triglycerides: 239 mg/dL — ABNORMAL HIGH (ref 0.0–149.0)
VLDL: 47.8 mg/dL — ABNORMAL HIGH (ref 0.0–40.0)

## 2014-08-16 ENCOUNTER — Telehealth: Payer: Self-pay | Admitting: *Deleted

## 2014-08-16 NOTE — Telephone Encounter (Signed)
pt notified about lab results with verbal understanding  

## 2015-01-05 ENCOUNTER — Encounter (HOSPITAL_COMMUNITY): Payer: Self-pay | Admitting: Emergency Medicine

## 2015-01-05 ENCOUNTER — Emergency Department (HOSPITAL_COMMUNITY)
Admission: EM | Admit: 2015-01-05 | Discharge: 2015-01-05 | Disposition: A | Discharge status: Home / Self Care | Payer: Managed Care, Other (non HMO) | Source: Home / Self Care | Attending: Family Medicine | Admitting: Family Medicine

## 2015-01-05 DIAGNOSIS — K629 Disease of anus and rectum, unspecified: Secondary | ICD-10-CM | POA: Diagnosis not present

## 2015-01-05 DIAGNOSIS — K602 Anal fissure, unspecified: Secondary | ICD-10-CM

## 2015-01-05 DIAGNOSIS — K625 Hemorrhage of anus and rectum: Secondary | ICD-10-CM | POA: Diagnosis not present

## 2015-01-05 MED ORDER — DILTIAZEM GEL 2 %: 1.0000 "application " | Freq: Two times a day (BID) | CUTANEOUS | Status: DC

## 2015-01-05 MED ORDER — POLYETHYLENE GLYCOL 3350 17 GM/SCOOP PO POWD: 17.0000 g | Freq: Every day | ORAL | Status: DC

## 2015-01-05 NOTE — ED Provider Notes (Signed)
Max Spencer is a 44 y.o. male who presents to Urgent Care today for rectal bleeding present for 2 weeks. He's had intermittent rectal bleeding occurring off and on for the last 20 years but it's been persistent for the last 2 weeks. He feels some pain with stools. He's tried some stool softeners which have not helped much. No fevers or chills nausea vomiting diarrhea or abdominal pain. He feels well otherwise.   Past Medical History  Diagnosis Date  . High blood pressure   . Chronic sinus infection   . 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 Hypertension   . Hyperlipidemia    Past Surgical History  Procedure Laterality Date  . Pilonidal cyst excision    . Pilonidal cyst excision  2007  . Wisdom tooth extraction    . Sinus endo w/fusion N/A 05/27/2013    Procedure: ENDOSCOPIC SINUS SURGERY WITH FUSION NAVIGATION;  Surgeon: Suzanna ObeyJohn Byers, MD;  Location: [facility];  Service: ENT;  Laterality: N/A;   History  Substance Use Topics  . Smoking status: Never Smoker   . Smokeless tobacco: Not on file  . Alcohol Use: No   ROS as above Medications: No current facility-administered medications for this encounter.   Current Outpatient Prescriptions  Medication Sig Dispense Refill  . amLODipine (NORVASC) 10 MG tablet Take 1 tablet (10 mg total) by mouth daily. 90 tablet 3  . aspirin 81 MG tablet Take 81 mg by mouth daily.    Marland Kitchen. diltiazem 2 % GEL Apply 1 application topically 2 (two) times daily. 30 g 2  . omega-3 acid ethyl esters (LOVAZA) 1 G capsule Take 1 capsule (1 g total) by mouth 2 (two) times daily. 180 capsule 3  . polyethylene glycol powder (GLYCOLAX/MIRALAX) powder Take 17 g by mouth daily. 850 g 1  . rosuvastatin (CRESTOR) 20 MG tablet Take 1 tablet (20 mg total) by mouth daily. 90 tablet 3   Allergies  Allergen Reactions  . Tetracyclines & Related     vertigo     Exam:  BP 153/95 mmHg  Pulse 64  Temp(Src) 99.1 F (37.3 C) (Oral)  Resp 14  SpO2  98% Gen: Well NAD HEENT: EOMI,  MMM Lungs: Normal work of breathing. CTABL Heart: RRR no MRG Abd: NABS, Soft. Nondistended, Nontender Exts: Brisk capillary refill, warm and well perfused.  Rectal exam: Normal appearing anus. Digital rectal exam without masses or lesions. Anoscope with rectal fissure present at the 6:00 position  No results found for this or any previous visit (from the past 24 hour(s)). No results found.  Assessment and Plan: 44 y.o. male with rectal fissure. Treat with diltiazem gel and MiraLAX. Follow-up with gastroenterology. Referral ordered.  Discussed warning signs or symptoms. Please see discharge instructions. Patient expresses understanding.     Rodolph BongEvan S Shaneya Taketa, MD 01/05/15 [phone]

## 2015-01-05 NOTE — ED Notes (Signed)
C/o rectal bleeding onset 2 weeks w/each stool Alert, no signs of acute distress.

## 2015-01-05 NOTE — Discharge Instructions (Signed)
Thank you for coming in today. Follow-up with gastroenterology Use diltiazem gel twice daily and take MiraLAX to produce soft stools. Take the prescription to a compounding pharmacy such as [facility] [address] or [facility] [place]   Anal Fissure, Adult An anal fissure is a small tear or crack in the skin around the anus. Bleeding from a fissure usually stops on its own within a few minutes. However, bleeding will often reoccur with each bowel movement until the crack heals.  CAUSES   Passing large, hard stools.  Frequent diarrheal stools.  Constipation.  Inflammatory bowel disease (Crohn's disease or ulcerative colitis).  Infections.  Anal sex. SYMPTOMS   Small amounts of blood seen on your stools, on toilet paper, or in the toilet after a bowel movement.  Rectal bleeding.  Painful bowel movements.  Itching or irritation around the anus. DIAGNOSIS Your caregiver will examine the anal area. An anal fissure can usually be seen with careful inspection. A rectal exam may be performed and a short tube (anoscope) may be used to examine the anal canal. TREATMENT   You may be instructed to take fiber supplements. These supplements can soften your stool to help make bowel movements easier.  Sitz baths may be recommended to help heal the tear. Do not use soap in the sitz baths.  A medicated cream or ointment may be prescribed to lessen discomfort. HOME CARE INSTRUCTIONS   Maintain a diet high in fruits, whole grains, and vegetables. Avoid constipating foods like bananas and dairy products.  Take sitz baths as directed by your caregiver.  Drink enough fluids to keep your urine clear or pale yellow.  Only take over-the-counter or prescription medicines for pain, discomfort, or fever as directed by your caregiver. Do not take aspirin as this may increase bleeding.  Do not use ointments containing numbing medications  (anesthetics) or hydrocortisone. They could slow healing. SEEK MEDICAL CARE IF:   Your fissure is not completely healed within 3 days.  You have further bleeding.  You have a fever.  You have diarrhea mixed with blood.  You have pain.  Your problem is getting worse rather than better. MAKE SURE YOU:   Understand these instructions.  Will watch your condition.  Will get help right away if you are not doing well or get worse. Document Released: 08/25/2005 Document Revised: 11/17/2011 Document Reviewed: 02/09/2011 [facility] Patient Information 2015 [place]. This information is not intended to replace advice given to you by your health care provider. Make sure you discuss any questions you have with your health care provider.

## 2015-02-08 ENCOUNTER — Encounter: Payer: Self-pay | Admitting: Physician Assistant

## 2015-02-14 ENCOUNTER — Encounter: Payer: Self-pay | Admitting: Cardiology

## 2015-03-01 ENCOUNTER — Ambulatory Visit: Payer: Managed Care, Other (non HMO) | Admitting: Physician Assistant

## 2015-04-20 ENCOUNTER — Other Ambulatory Visit: Payer: Self-pay | Admitting: Physician Assistant

## 2015-05-04 ENCOUNTER — Ambulatory Visit (INDEPENDENT_AMBULATORY_CARE_PROVIDER_SITE_OTHER): Payer: Managed Care, Other (non HMO) | Admitting: Cardiovascular Disease

## 2015-05-04 ENCOUNTER — Encounter: Payer: Self-pay | Admitting: Cardiovascular Disease

## 2015-05-04 VITALS — BP 130/82 | HR 61 | Ht 71.0 in | Wt 210.8 lb

## 2015-05-04 DIAGNOSIS — E785 Hyperlipidemia, unspecified: Secondary | ICD-10-CM

## 2015-05-04 DIAGNOSIS — I10 Essential (primary) hypertension: Secondary | ICD-10-CM | POA: Diagnosis not present

## 2015-05-04 LAB — LIPID PANEL
CHOL/HDL RATIO: 6
CHOLESTEROL: 188 mg/dL (ref 0–200)
HDL: 33.8 mg/dL — ABNORMAL LOW (ref >39.00)
NonHDL: 154.11
Triglycerides: 292 mg/dL — ABNORMAL HIGH (ref 0.0–149.0)
VLDL: 58.4 mg/dL — ABNORMAL HIGH (ref 0.0–40.0)

## 2015-05-04 LAB — HEPATIC FUNCTION PANEL
ALT: 19 U/L (ref 0–53)
AST: 21 U/L (ref 0–37)
Albumin: 4.3 g/dL (ref 3.5–5.2)
Alkaline Phosphatase: 72 U/L (ref 39–117)
BILIRUBIN DIRECT: 0.1 mg/dL (ref 0.0–0.3)
TOTAL PROTEIN: 7.5 g/dL (ref 6.0–8.3)
Total Bilirubin: 0.4 mg/dL (ref 0.2–1.2)

## 2015-05-04 LAB — LDL CHOLESTEROL, DIRECT: Direct LDL: 48 mg/dL

## 2015-05-04 LAB — CK: Total CK: 183 U/L (ref 7–232)

## 2015-05-04 NOTE — Progress Notes (Signed)
Cardiology Office Note Date:  05/04/2015   ID:  Max Spencer, DOB Nov 18, 1970, MRN [id number]  PCP:  Katy Apo, MD  Cardiologist:  Tonny Bollman, MD    Chief Complaint  Patient presents with  . Hypertension    History of Present Illness: Max Spencer is a 44 y.o. male who presents for follow-up of essential hypertension. The patient has been seen in the past for palpitations related to PVCs. He is doing well from a cardiac perspective. He denies chest pain, chest pressure, shortness of breath, lightheadedness, or leg swelling. He is physically active without exertional symptoms. He is compliant with his medications.   Past Medical History  Diagnosis Date  . High blood pressure   . Chronic sinus infection   . 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 Hypertension   . Hyperlipidemia     Past Surgical History  Procedure Laterality Date  . Pilonidal cyst excision    . Pilonidal cyst excision  2007  . Wisdom tooth extraction    . Sinus endo w/fusion N/A 05/27/2013    Procedure: ENDOSCOPIC SINUS SURGERY WITH FUSION NAVIGATION;  Surgeon: Suzanna Obey, MD;  Location: [facility];  Service: ENT;  Laterality: N/A;    Current Outpatient Prescriptions  Medication Sig Dispense Refill  . amLODipine (NORVASC) 10 MG tablet TAKE 1 TABLET BY MOUTH DAILY 90 tablet 0  . CRESTOR 20 MG tablet TAKE 1 TABLET BY MOUTH DAILY 90 tablet 0  . diltiazem 2 % GEL Apply 1 application topically 2 (two) times daily. 30 g 2  . omega-3 acid ethyl esters (LOVAZA) 1 G capsule TAKE 1 CAPSULE BY MOUTH 2 TIMES DAILY. 180 capsule 0  . polyethylene glycol powder (GLYCOLAX/MIRALAX) powder Take 17 g by mouth daily. 850 g 1   No current facility-administered medications for this visit.    Allergies:   Tetracyclines & related   Social History:  The patient  reports that he has never smoked. He does not have any smokeless tobacco history on file. He reports that he does not drink  alcohol or use illicit drugs.   Family History:  The patient's  family history includes Cancer in his maternal grandmother; Diabetes in his maternal grandfather; Hyperlipidemia in his father, maternal grandfather, and mother; Hypertension in his maternal grandmother; Sudden death in his father.    ROS:  Please see the history of present illness.  All other systems are reviewed and negative.    PHYSICAL EXAM: VS:  BP 130/82 mmHg  Pulse 61  Ht 5\' 11"  (1.803 m)  Wt 210 lb 12.8 oz (95.618 kg)  BMI 29.41 kg/m2 , BMI Body mass index is 29.41 kg/(m^2). GEN: Well nourished, well developed, in no acute distress HEENT: normal Neck: no JVD, no masses. No carotid bruits Cardiac: RRR without murmur or gallop                Respiratory:  clear to auscultation bilaterally, normal work of breathing GI: soft, nontender, nondistended, + BS MS: no deformity or atrophy Ext: no pretibial edema, pedal pulses 2+= bilaterally Skin: warm and dry, no rash Neuro:  Strength and sensation are intact Psych: euthymic mood, full affect  EKG:  EKG is ordered today. The ekg ordered today shows normal sinus rhythm 61 bpm, within normal limits.  Recent Labs: 08/15/2014: ALT 23   Lipid Panel     Component Value Date/Time   CHOL 218* 08/15/2014 1208   TRIG 239.0* 08/15/2014 1208  HDL 38.80* 08/15/2014 1208   CHOLHDL 6 08/15/2014 1208   VLDL 47.8* 08/15/2014 1208   LDLDIRECT 96.4 08/15/2014 1208      Wt Readings from Last 3 Encounters:  05/04/15 210 lb 12.8 oz (95.618 kg)  04/05/14 211 lb (95.709 kg)  05/27/13 217 lb (98.431 kg)     Cardiac Studies Reviewed: 2D Echo 01/04/13: Study Conclusions  Left ventricle: The cavity size was normal. Wall thickness was increased in a pattern of mild LVH. The estimated ejection fraction was 65%. Wall motion was normal; there were no regional wall motion abnormalities. Left ventricular diastolic function parameters were normal.  ASSESSMENT AND PLAN: Essential  hypertension: The patient is doing well and his blood pressure appears to be well controlled. He has no overt symptoms. Recommend that he continue amlodipine 10 mg daily. Lipids have been followed by his primary physician but he requests that we do his lab work today. Will arrange. I will see him back in one year for follow-up.   Current medicines are reviewed with the patient today.  The patient does not have concerns regarding medicines.  Labs/ tests ordered today include:   Orders Placed This Encounter  Procedures  . Lipid panel  . Hepatic function panel  . CK (Creatine Kinase)  . EKG 12-Lead    Disposition:   FU one year  Signed, Tonny Bollman, MD  05/04/2015 1:11 PM    [facility] [address] [place], [place], [place]  [address] Phone: [phone]; Fax: [phone]

## 2015-05-04 NOTE — Patient Instructions (Signed)
Medication Instructions:  Your physician recommends that you continue on your current medications as directed. Please refer to the Current Medication list given to you today.  Labwork: Your physician recommends that you have lab work today: LIPID, LIVER and CK  Testing/Procedures: No new orders.   Follow-Up: Your physician wants you to follow-up in: 1 YEAR with Dr Excell Seltzer.  You will receive a reminder letter in the mail two months in advance. If you don't receive a letter, please call our office to schedule the follow-up appointment.   Any Other Special Instructions Will Be Listed Below (If Applicable).

## 2015-10-18 ENCOUNTER — Other Ambulatory Visit: Payer: Self-pay | Admitting: Cardiovascular Disease

## 2015-10-18 MED FILL — ROSUVASTATIN CALCIUM 20 MG: 20 | 90 days supply | Qty: 90 | Fill #0

## 2015-10-18 MED FILL — OMEGA-3 ETHYL ESTERS 1 GM C: 1 | 90 days supply | Qty: 180 | Fill #0

## 2015-10-18 MED FILL — AMLODIPINE BESYLATE 10 MG T: 10 | 90 days supply | Qty: 90 | Fill #0

## 2016-03-31 ENCOUNTER — Encounter: Payer: Self-pay | Admitting: Cardiovascular Disease

## 2016-03-31 ENCOUNTER — Ambulatory Visit (INDEPENDENT_AMBULATORY_CARE_PROVIDER_SITE_OTHER): Payer: Managed Care, Other (non HMO) | Admitting: Cardiovascular Disease

## 2016-03-31 ENCOUNTER — Telehealth: Payer: Self-pay

## 2016-03-31 VITALS — BP 142/96 | HR 56 | Ht 71.0 in | Wt 216.0 lb

## 2016-03-31 DIAGNOSIS — I10 Essential (primary) hypertension: Secondary | ICD-10-CM | POA: Diagnosis not present

## 2016-03-31 MED ORDER — AMLODIPINE BESYLATE 5 MG PO TABS: 5.0000 mg | ORAL_TABLET | Freq: Every day | ORAL | 11 refills | Status: DC

## 2016-03-31 NOTE — Progress Notes (Signed)
Cardiology Office Note Date:  04/01/2016   ID:  ORBIE GRUPE, DOB 21-Oct-1970, MRN [id number]  PCP:  Katy Apo, MD  Cardiologist:  Tonny Bollman, MD    Chief Complaint  Patient presents with  . Follow-up    HTN     History of Present Illness: Max Spencer is a 45 y.o. male who presents for follow-up of palpitations and hypertension. He has undergone previous echo and ETT, both normal, in 2014.  The patient is doing well. He's only been taking amlodipine every other day because of low blood pressures. His home readings have been in range. He's had occasional lightheadedness and fatigue when his blood pressure is low. All of this is straightened out with every other day dosing. He denies chest pain, chest pressure, or shortness of breath. He is initially scheduled here for a preoperative assessment because of a meniscal tear in his knee. However, he is reconsidering surgery because his symptoms are improving with conservative care.   Past Medical History:  Diagnosis Date  . Chronic sinus infection   . 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 High blood pressure   . Hyperlipidemia   . Hypertension     Past Surgical History:  Procedure Laterality Date  . PILONIDAL CYST EXCISION    . PILONIDAL CYST EXCISION  2007  . SINUS ENDO W/FUSION N/A 05/27/2013   Procedure: ENDOSCOPIC SINUS SURGERY WITH FUSION NAVIGATION;  Surgeon: Suzanna Obey, MD;  Location: [facility];  Service: ENT;  Laterality: N/A;  . WISDOM TOOTH EXTRACTION      Current Outpatient Prescriptions  Medication Sig Dispense Refill  . amLODipine (NORVASC) 5 MG tablet Take 1 tablet (5 mg total) by mouth daily. 30 tablet 11  . diltiazem 2 % GEL Apply 1 application topically 2 (two) times daily. 30 g 2  . omega-3 acid ethyl esters (LOVAZA) 1 g capsule TAKE 1 CAPSULE BY MOUTH TWICE DAILY 180 capsule 3  . polyethylene glycol powder (GLYCOLAX/MIRALAX) powder Take 17 g by mouth daily. 850 g 1    . rosuvastatin (CRESTOR) 20 MG tablet TAKE 1 TABLET BY MOUTH ONCE DAILY 90 tablet 3   No current facility-administered medications for this visit.     Allergies:   Tetracyclines & related   Social History:  The patient  reports that he has never smoked. He does not have any smokeless tobacco history on file. He reports that he does not drink alcohol or use drugs.   Family History:  The patient's  family history includes Cancer in his maternal grandmother; Diabetes in his maternal grandfather; Hyperlipidemia in his father, maternal grandfather, and mother; Hypertension in his maternal grandmother; Sudden death in his father.    ROS:  Please see the history of present illness.  Otherwise, review of systems is positive for back pain, palpitations.  All other systems are reviewed and negative.    PHYSICAL EXAM: VS:  BP (!) 142/96   Pulse (!) 56   Ht 5\' 11"  (1.803 m)   Wt 98 kg (216 lb)   BMI 30.13 kg/m  , BMI Body mass index is 30.13 kg/m. GEN: Well nourished, well developed, in no acute distress  HEENT: normal  Neck: no JVD, no masses. No carotid bruits Cardiac: RRR without murmur or gallop                Respiratory:  clear to auscultation bilaterally, normal work of breathing GI: soft, nontender, nondistended, + BS MS:  no deformity or atrophy  Ext: no pretibial edema, pedal pulses 2+= bilaterally Skin: warm and dry, no rash Neuro:  Strength and sensation are intact Psych: euthymic mood, full affect  EKG:  EKG is ordered today. The ekg ordered today shows sinus bradycardia 56 bpm, otherwise within normal limits.  Recent Labs: 05/04/2015: ALT 19   Lipid Panel     Component Value Date/Time   CHOL 188 05/04/2015 1044   TRIG 292.0 (H) 05/04/2015 1044   HDL 33.80 (L) 05/04/2015 1044   CHOLHDL 6 05/04/2015 1044   VLDL 58.4 (H) 05/04/2015 1044   LDLDIRECT 48.0 05/04/2015 1044      Wt Readings from Last 3 Encounters:  03/31/16 98 kg (216 lb)  05/04/15 95.6 kg (210 lb  12.8 oz)  04/05/14 95.7 kg (211 lb)     Cardiac Studies Reviewed: Echo 01-04-2013: Left ventricle: The cavity size was normal. Wall thickness was increased in a pattern of mild LVH. The estimated ejection fraction was 65%. Wall motion was normal; there were no regional wall motion abnormalities. Left ventricular diastolic function parameters were normal.  ------------------------------------------------------------ Aortic valve:  Structurally normal valve.  Cusp separation was normal. Doppler: Transvalvular velocity was within the normal range. There was no stenosis. No regurgitation.  ------------------------------------------------------------ Aorta: Aortic root: The aortic root was normal in size.  ------------------------------------------------------------ Mitral valve:  Structurally normal valve.  Leaflet separation was normal. Doppler: Transvalvular velocity was within the normal range. There was no evidence for stenosis. No regurgitation.  ------------------------------------------------------------ Left atrium: The atrium was normal in size.  ------------------------------------------------------------ Right ventricle: The cavity size was normal. Systolic function was normal.  ------------------------------------------------------------ Pulmonic valve:  The valve appears to be grossly normal. Doppler:  No significant regurgitation.  ------------------------------------------------------------ Tricuspid valve:  Structurally normal valve.  Leaflet separation was normal. Doppler: Transvalvular velocity was within the normal range. Trivial regurgitation.  ------------------------------------------------------------ Right atrium: The atrium was at the upper limits of normal in size.  ------------------------------------------------------------ Pericardium: There was no pericardial effusion.  ETT 02-16-2013: Exercise Tolerance Test Ordering  MD: Tonny Bollman, MD  Interpreting MD: Jacolyn Reedy, PA-C  Unique Test No: 1  Treadmill:  1  Indication for ETT: chest pain - rule out ischemia  Contraindication to ETT: No   Stress Modality: exercise - treadmill  Cardiac Imaging Performed: non   Protocol: standard Bruce - maximal  Max BP:  185/73  Max MPHR (bpm): 179 85% MPR (bpm):  152  MPHR obtained (bpm):  176 % MPHR obtained:  98%  Reached 85% MPHR (min:sec):  11:00 Total Exercise Time (min-sec):  13:24  Workload in METS:  16.1 Borg Scale: 17  Reason ETT Terminated:  fatigue    ST Segment Analysis At Rest:                     normal ST segments - no evidence of significant ST depression With Exercise:           no evidence of significant ST depression  Other Information Arrhythmia:  No                     Angina during ETT:  absent (0) Quality of ETT:  diagnostic  ETT Interpretation:  normal - no evidence of ischemia by ST analysis  Comments: Good exercise tolerance. Hypertensive response to exercise   Recommendations: F/u Dr. Excell Seltzer  ASSESSMENT AND PLAN: 1.  Essential hypertension: Will change amlodipine to 5 mg daily to reduce his dose and put  him on a daily regimen. He will continue to monitor blood pressure periodically.  2. Heart palpitations: Benign pattern. No change in symptoms.  3. Preoperative evaluation: The patient is at low risk of cardiac complications related to surgery. He has no symptoms with physical exertion and can clearly achieve much greater than 4 metabolic equivalents.  Current medicines are reviewed with the patient today.  The patient does not have concerns regarding medicines.  Labs/ tests ordered today include:   Orders Placed This Encounter  Procedures  . EKG 12-Lead    Disposition:   FU one year  Signed, Tonny Bollman, MD  04/01/2016 5:58 AM    [facility] [address] [place], [place], [place]  [address] Phone: [phone]; Fax: [phone]

## 2016-03-31 NOTE — Patient Instructions (Signed)
Medication Instructions:  Your physician has recommended you make the following change in your medication:  REDUCE Amlodipine to 5mg  daily. An Rx has been sent to your pharmacy   Labwork: None ordered  Testing/Procedures: None ordered  Follow-Up: Your physician wants you to follow-up in: 1 year with Dr.Cooper  You will receive a reminder letter in the mail two months in advance. If you don't receive a letter, please call our office to schedule the follow-up appointment.   Any Other Special Instructions Will Be Listed Below (If Applicable). You have been cleared for surgery with Dr.Wainer     If you need a refill on your cardiac medications before your next appointment, please call your pharmacy.

## 2016-03-31 NOTE — Telephone Encounter (Signed)
Cardiac clearance placed in MR nurse fax box to be faxed to [facility] 

## 2016-04-11 MED FILL — ROSUVASTATIN CALCIUM 20 MG: 20 | 90 days supply | Qty: 90 | Fill #1

## 2016-04-11 MED FILL — OMEGA-3 ETHYL ESTERS 1 GM C: 1 | 90 days supply | Qty: 180 | Fill #1

## 2016-04-11 MED FILL — AMLODIPINE BESYLATE 5 MG TA: 5 | 30 days supply | Qty: 30 | Fill #0

## 2016-07-09 MED FILL — AMLODIPINE BESYLATE 5 MG TA: 5 | 30 days supply | Qty: 30 | Fill #1

## 2016-07-09 MED FILL — OMEGA-3 ETHYL ESTERS 1 GM C: 1 | 30 days supply | Qty: 60 | Fill #2

## 2016-07-09 MED FILL — ROSUVASTATIN CALCIUM 20 MG: 20 | 30 days supply | Qty: 30 | Fill #2

## 2016-08-19 DIAGNOSIS — R05 Cough: Secondary | ICD-10-CM | POA: Diagnosis not present

## 2016-08-22 DIAGNOSIS — J019 Acute sinusitis, unspecified: Secondary | ICD-10-CM | POA: Diagnosis not present

## 2016-08-22 DIAGNOSIS — J209 Acute bronchitis, unspecified: Secondary | ICD-10-CM | POA: Diagnosis not present

## 2016-08-26 ENCOUNTER — Ambulatory Visit (INDEPENDENT_AMBULATORY_CARE_PROVIDER_SITE_OTHER): Payer: BLUE CROSS/BLUE SHIELD | Admitting: Family Medicine

## 2016-08-26 ENCOUNTER — Encounter: Payer: Self-pay | Admitting: Family Medicine

## 2016-08-26 VITALS — BP 132/80 | HR 90 | Resp 12 | Ht 71.0 in | Wt 218.0 lb

## 2016-08-26 DIAGNOSIS — E785 Hyperlipidemia, unspecified: Secondary | ICD-10-CM

## 2016-08-26 DIAGNOSIS — R05 Cough: Secondary | ICD-10-CM | POA: Diagnosis not present

## 2016-08-26 DIAGNOSIS — R059 Cough, unspecified: Secondary | ICD-10-CM

## 2016-08-26 DIAGNOSIS — K219 Gastro-esophageal reflux disease without esophagitis: Secondary | ICD-10-CM

## 2016-08-26 DIAGNOSIS — I10 Essential (primary) hypertension: Secondary | ICD-10-CM

## 2016-08-26 MED ORDER — AMLODIPINE BESYLATE 5 MG PO TABS: 5.0000 mg | ORAL_TABLET | Freq: Every day | ORAL | 2 refills | Status: DC

## 2016-08-26 MED ORDER — ROSUVASTATIN CALCIUM 20 MG PO TABS: 20.0000 mg | ORAL_TABLET | Freq: Every day | ORAL | 3 refills | Status: DC

## 2016-08-26 MED ORDER — OMEPRAZOLE 40 MG PO CPDR: 40.0000 mg | DELAYED_RELEASE_CAPSULE | Freq: Every day | ORAL | 3 refills | Status: DC

## 2016-08-26 NOTE — Patient Instructions (Signed)
A few things to remember from today's visit:   Essential hypertension - Plan: amLODipine (NORVASC) 5 MG tablet, Comprehensive metabolic panel, CANCELED: Comprehensive metabolic panel  Gastroesophageal reflux disease, esophagitis presence not specified - Plan: omeprazole (PRILOSEC) 40 MG capsule  Hyperlipidemia, unspecified hyperlipidemia type - Plan: rosuvastatin (CRESTOR) 20 MG tablet, Lipid panel, Comprehensive metabolic panel, CANCELED: Lipid panel   Blood pressure goal for most people is less than 140/90.  Elevated blood pressure increases the risk of strokes, heart and kidney disease, and eye problems. Regular physical activity and a healthy diet (DASH diet) usually help. Low salt diet. Take medications as instructed. Caution with some over the counter medications as cold medications, dietary products (for weight loss), and Ibuprofen or Aleve (frequent use);all these medications could cause elevation of blood pressure.    Please be sure medication list is accurate. If a new problem present, please set up appointment sooner than planned today.

## 2016-08-26 NOTE — Progress Notes (Signed)
HPI:   [name] is a 45 y.o. male, who is here today to establish care with me.  Former PCP: Max Spencer (cardiology) Last preventive routine visit: 2013.   Concerns today: Follow up on some of his chronic medical problems and refills.  Hypertension:  2-3 years ago started on antihypertensive but it was borderline for a while.   Currently on Amlodipine 5 mg daily.    He is taking medications as instructed, no side effects reported.  He has not noted unusual headache, visual changes, exertional chest pain, dyspnea,  focal weakness, or edema.   Lab Results  Component Value Date   CREATININE 1.2 04/05/2014   BUN 12 04/05/2014   NA 138 04/05/2014   K 4.0 04/05/2014   CL 103 04/05/2014   CO2 29 04/05/2014     HLD:  He is on Crestor 20 mg daily and Lovaza 1 g bid. Tolerating medication well. He denies Hx of CVD.   He does not exercise regularly but he follows a healthy diet. 01/2016 torn left knee meniscus,so not able to exercise, states that knee is feeling better.  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  -He is also reporting recent visit to Urgent care last week for "congestion.", He was started on Augmentin and Prednisone but symptoms not resolved. + Mainly non productive cough for a month, usually in the morning after getting up. + Post nasal drainage and nasal congestion.   CXR done was "clear."   + Heartburn, getting worse in the past few months. Takes Zantac OTC as needed. Sweets in general exacerbate symptoms.   Denies abdominal pain, nausea, vomiting, changes in bowel habits, blood in stool or melena. .    Review of Systems  Constitutional: Negative for activity change, appetite change, fatigue, fever and unexpected weight change.  HENT: Positive for congestion, postnasal drip and rhinorrhea. Negative for nosebleeds, sore throat and trouble swallowing.     Eyes: Negative for redness and visual disturbance.  Respiratory: Positive for cough. Negative for apnea, shortness of breath and wheezing.   Cardiovascular: Negative for chest pain, palpitations and leg swelling.  Gastrointestinal: Negative for abdominal pain, nausea and vomiting.       No changes in bowel habits.  Genitourinary: Negative for decreased urine volume, difficulty urinating and hematuria.  Musculoskeletal: Negative for myalgias.  Skin: Negative for color change and rash.  Allergic/Immunologic: Positive for environmental allergies.  Neurological: Negative for dizziness, syncope, weakness and headaches.  Psychiatric/Behavioral: Negative for confusion. The patient is not nervous/anxious.       Current Outpatient Prescriptions on File Prior to Visit  Medication Sig Dispense Refill  . omega-3 acid ethyl esters (LOVAZA) 1 g capsule TAKE 1 CAPSULE BY MOUTH TWICE DAILY 180 capsule 3  . polyethylene glycol powder (GLYCOLAX/MIRALAX) powder Take 17 g by mouth daily. 850 g 1   No current facility-administered medications on file prior to visit.      Past Medical History:  Diagnosis Date  . Chronic sinus infection   . 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 High blood pressure   . Hyperlipidemia   . Hypertension    Allergies  Allergen Reactions  . Tetracyclines & Related     vertigo    Family History  Problem Relation Age of Onset  . Cancer Maternal Grandmother   . Diabetes Maternal Grandfather   .  Hyperlipidemia Maternal Grandfather   . Hyperlipidemia Father   . Hyperlipidemia Mother   . Hypertension Maternal Grandmother   . Sudden death Father     Social History   Social History  . Marital status: Married    Spouse name: N/A  . Number of children: N/A  . Years of education: N/A   Social History Main Topics  . Smoking status: Never Smoker  . Smokeless tobacco: None  . Alcohol use No  . Drug use: No  . Sexual activity: Not Currently   Other  Topics Concern  . None   Social History Narrative   ** Merged History Encounter **       He is married. He does not smoke cigarettes or drink alcohol. His children are ages 6420, 9416, and 623. 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    Vitals:   08/26/16 1504  BP: 132/80  Pulse: 90  Resp: 12   O2 sat 98% at RA.  Body mass index is 30.4 kg/m.   Physical Exam  Nursing note and vitals reviewed. Constitutional: He is oriented to person, place, and time. He appears well-developed. No distress.  HENT:  Head: Atraumatic.  Mouth/Throat: Oropharynx is clear and moist and mucous membranes are normal.  Eyes: Conjunctivae and EOM are normal. Pupils are equal, round, and reactive to light.  Neck: No thyroid mass and no thyromegaly present.  Cardiovascular: Normal rate and regular rhythm.   No murmur heard. Pulses:      Dorsalis pedis pulses are 2+ on the right side, and 2+ on the left side.  Respiratory: Effort normal and breath sounds normal. No respiratory distress.  GI: Soft. He exhibits no mass. There is no hepatomegaly. There is no tenderness.  Musculoskeletal: He exhibits no edema.  Lymphadenopathy:    He has no cervical adenopathy.  Neurological: He is alert and oriented to person, place, and time. He has normal strength. Coordination and gait normal.  Skin: Skin is warm. No erythema.  Psychiatric: He has a normal mood and affect. Cognition and memory are normal.  Well groomed, good eye contact.      ASSESSMENT AND PLAN:     Max SporeWesley was seen today for establish care.  Diagnoses and all orders for this visit:  Essential hypertension  Adequately controlled. No changes in current management. DASH diet recommended. Eye exam recommended annually. F/U in 6 months, before if needed.  -     amLODipine (NORVASC) 5 MG tablet; Take 1 tablet (5 mg total) by mouth daily. -     Comprehensive metabolic panel; Future  Gastroesophageal  reflux disease, esophagitis presence not specified  This could be aggravating/causing cough. He is symptomatic, treatment options discussed, and side effects of PPI's. He agrees with trying Omeprazole 40 mg. GRED precautions discussed and recommended. He will let me know about treatment tolerance and symptoms through My Chart in 6-8 weeks.  -     omeprazole (PRILOSEC) 40 MG capsule; Take 1 capsule (40 mg total) by mouth daily.  Hyperlipidemia, unspecified hyperlipidemia type  No changes in current management, will follow labs done today and will give further recommendations accordingly. He is not fasting today ,so coming back tomorrow.  -     rosuvastatin (CRESTOR) 20 MG tablet; Take 1 tablet (20 mg total) by mouth daily. -     Lipid panel; Future -     Comprehensive metabolic panel; Future  Cough  We discussed possible etiologies, allergies  and GERD among some. Reporting CXR done and negative, lung auscultation today negative. GERD treatment started today.       Avishai Reihl G. [place], MD  [facility]. [place] office.

## 2016-08-26 NOTE — Progress Notes (Signed)
Pre visit review using our clinic review tool, if applicable. No additional management support is needed unless otherwise documented below in the visit note. 

## 2016-08-27 ENCOUNTER — Other Ambulatory Visit (INDEPENDENT_AMBULATORY_CARE_PROVIDER_SITE_OTHER): Payer: BLUE CROSS/BLUE SHIELD

## 2016-08-27 ENCOUNTER — Encounter: Payer: Self-pay | Admitting: Family Medicine

## 2016-08-27 DIAGNOSIS — E785 Hyperlipidemia, unspecified: Secondary | ICD-10-CM

## 2016-08-27 LAB — COMPREHENSIVE METABOLIC PANEL
ALT: 26 U/L (ref 0–53)
AST: 20 U/L (ref 0–37)
Albumin: 4.2 g/dL (ref 3.5–5.2)
Alkaline Phosphatase: 58 U/L (ref 39–117)
BILIRUBIN TOTAL: 0.5 mg/dL (ref 0.2–1.2)
BUN: 15 mg/dL (ref 6–23)
CALCIUM: 9.7 mg/dL (ref 8.4–10.5)
CHLORIDE: 101 meq/L (ref 96–112)
CO2: 32 meq/L (ref 19–32)
CREATININE: 1.17 mg/dL (ref 0.40–1.50)
GFR: 86.86 mL/min (ref >60.00)
GLUCOSE: 92 mg/dL (ref 70–99)
Potassium: 4 mEq/L (ref 3.5–5.1)
Sodium: 140 mEq/L (ref 135–145)
Total Protein: 7 g/dL (ref 6.0–8.3)

## 2016-08-27 LAB — LIPID PANEL
CHOL/HDL RATIO: 3
Cholesterol: 159 mg/dL (ref 0–200)
HDL: 51.8 mg/dL (ref >39.00)
LDL CALC: 79 mg/dL (ref 0–99)
NonHDL: 106.75
Triglycerides: 141 mg/dL (ref 0.0–149.0)
VLDL: 28.2 mg/dL (ref 0.0–40.0)

## 2016-09-26 ENCOUNTER — Encounter: Payer: Self-pay | Admitting: Family Medicine

## 2016-10-20 ENCOUNTER — Other Ambulatory Visit: Payer: Self-pay

## 2016-10-20 DIAGNOSIS — K219 Gastro-esophageal reflux disease without esophagitis: Secondary | ICD-10-CM

## 2016-10-20 MED ORDER — OMEPRAZOLE 40 MG PO CPDR: 40.0000 mg | DELAYED_RELEASE_CAPSULE | Freq: Every day | ORAL | 1 refills | Status: DC

## 2017-02-23 NOTE — Progress Notes (Signed)
HPI:   Max Spencer is a 46 y.o. male, who is here today to follow on some chronic medical problems.  HTN: Dx about 4 years ago. He is on Amlodipine 5 mg. Denies severe/frequent headache, visual changes, chest pain, dyspnea, palpitation, claudication, focal weakness, or edema.  Lab Results  Component Value Date   CREATININE 1.17 08/27/2016   BUN 15 08/27/2016   NA 140 08/27/2016   K 4.0 08/27/2016   CL 101 08/27/2016   CO2 32 08/27/2016    GERD: Started on Omeprazole 40 mg daily, he was c/o persistent cough last OV. Cough has improved, re-occurring when he discontinued the PPI, so he resumed it.   Denies abdominal pain, nausea, vomiting, changes in bowel habits, blood in stool or melena.   Hyperlipidemia:  Currently on Crestor 20 mg, which he decreased to q 2 days because it is causing aching of UE. He discontinued medication for a few weeks and pain resolved. Aching sensation has improved taking med less frequent but still present.  Following a low fat diet: Yes.   Lab Results  Component Value Date   CHOL 159 08/27/2016   HDL 51.80 08/27/2016   LDLCALC 79 08/27/2016   LDLDIRECT 48.0 05/04/2015   TRIG 141.0 08/27/2016   CHOLHDL 3 08/27/2016   Lipitor was not well tolerated either, he felt "fuggy" while he was taking it.  Concerns today:    3 weeks of left lateral left elbow pain, achy, 4/10.exacerbated at night when sleeping on either side, interfering worth sleep. Ibuprofen does not help. Started 1-2 days after fishing tournament, no hx of trauma. OTC band does not help either. It has improved Left handed.    Review of Systems  Constitutional: Positive for fatigue. Negative for activity change, appetite change, fever and unexpected weight change.  HENT: Negative for nosebleeds, sore throat and trouble swallowing.   Eyes: Negative for redness and visual disturbance.  Respiratory: Negative for shortness of breath and wheezing.     Cardiovascular: Negative for chest pain, palpitations and leg swelling.  Gastrointestinal: Negative for abdominal pain, nausea and vomiting.       No changes in bowel movements.  Genitourinary: Negative for decreased urine volume and hematuria.  Musculoskeletal: Positive for arthralgias and myalgias. Negative for neck pain.  Skin: Negative for color change and rash.  Neurological: Negative for syncope, weakness, numbness and headaches.  Psychiatric/Behavioral: Positive for sleep disturbance. Negative for confusion.      Current Outpatient Prescriptions on File Prior to Visit  Medication Sig Dispense Refill  . amLODipine (NORVASC) 5 MG tablet Take 1 tablet (5 mg total) by mouth daily. 90 tablet 2  . omega-3 acid ethyl esters (LOVAZA) 1 g capsule TAKE 1 CAPSULE BY MOUTH TWICE DAILY 180 capsule 3  . polyethylene glycol powder (GLYCOLAX/MIRALAX) powder Take 17 g by mouth daily. 850 g 1   No current facility-administered medications on file prior to visit.      Past Medical History:  Diagnosis Date  . Chronic sinus infection   . 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 High blood pressure   . Hyperlipidemia   . Hypertension    Allergies  Allergen Reactions  . Tetracyclines & Related     vertigo    Social History   Social History  . Marital status: Married    Spouse name: N/A  . Number of children: N/A  . Years of education: N/A   Social History Main Topics  .  Smoking status: Never Smoker  . Smokeless tobacco: Never Used  . Alcohol use No  . Drug use: No  . Sexual activity: Not Currently   Other Topics Concern  . None   Social History Narrative   ** Merged History Encounter **       He is married. He does not smoke cigarettes or drink alcohol. His children are ages 7820, 3416, and 293. 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    Vitals:   02/24/17 0954  BP: 122/80  Pulse: 62  Resp: 12  O2 sat at RA 96% Body mass  index is 30.68 kg/m.   Physical Exam  Nursing note and vitals reviewed. Constitutional: He is oriented to person, place, and time. He appears well-developed. No distress.  HENT:  Head: Atraumatic.  Mouth/Throat: Oropharynx is clear and moist and mucous membranes are normal.  Eyes: Conjunctivae and EOM are normal. Pupils are equal, round, and reactive to light.  Cardiovascular: Normal rate and regular rhythm.   No murmur heard. Pulses:      Radial pulses are 2+ on the left side.       Dorsalis pedis pulses are 2+ on the right side, and 2+ on the left side.  Respiratory: Effort normal and breath sounds normal. No respiratory distress.  GI: Soft. He exhibits no mass. There is no hepatomegaly. There is no tenderness.  Musculoskeletal: He exhibits no edema.       Left elbow: He exhibits normal range of motion, no swelling and no deformity. Tenderness (Upon palpation around lateral epicondyle. ) found.  ROM elicits pain: Flexion and extension. Pronation against resistance also causes pain.  Lymphadenopathy:    He has no cervical adenopathy.  Neurological: He is alert and oriented to person, place, and time. He has normal strength. No sensory deficit. Gait normal.  Skin: Skin is warm. No rash noted. No erythema.  Psychiatric: He has a normal mood and affect. Cognition and memory are normal.  Well groomed, good eye contact.    ASSESSMENT AND PLAN:   [name] was seen today for follow-up.  Diagnoses and all orders for this visit:  Lab Results  Component Value Date   CHOL 207 (H) 02/24/2017   HDL 28.70 (L) 02/24/2017   LDLCALC 79 08/27/2016   LDLDIRECT 39.0 02/24/2017   TRIG (H) 02/24/2017    440.0 Triglyceride is over 400; calculations on Lipids are invalid.   CHOLHDL 7 02/24/2017   Lab Results  Component Value Date   CREATININE 1.09 02/24/2017   BUN 14 02/24/2017   NA 137 02/24/2017   K 4.3 02/24/2017   CL 102 02/24/2017   CO2 31 02/24/2017    Lateral epicondylitis of  left elbow  Educated about Dx, prognosis,and treatment options. Tramadol recommended to take at night, some side effects discussed. Local ice. PT will be arranged.  -     traMADol (ULTRAM) 50 MG tablet; Take 1 tablet (50 mg total) by mouth every 12 (twelve) hours as needed. -     Ambulatory referral to Physical Therapy  Gastroesophageal reflux disease, esophagitis presence not specified  Well controlled. No changes in current management. F/U in 12 months.  -     omeprazole (PRILOSEC) 40 MG capsule; Take 1 capsule (40 mg total) by mouth daily.  Essential hypertension  Adequately controlled. No changes in current management. DASH diet recommended. Eye exam recommended annually. F/U in 6 months, before if needed.  -  Basic metabolic panel  Mixed hyperlipidemia  Discontinue Crestor. Will consider Livalo, Lopid, or Fenofibrate.. Continue low fat diet. F/U in 4-6 months.  -     Lipid panel -     LDL cholesterol, direct     -Mr. CHETT TANIGUCHI was advised to return sooner than planned today if new concerns arise.       Khanh Cordner G. [place], MD  [facility]. [place] office.

## 2017-02-24 ENCOUNTER — Encounter: Payer: Self-pay | Admitting: Family Medicine

## 2017-02-24 ENCOUNTER — Ambulatory Visit (INDEPENDENT_AMBULATORY_CARE_PROVIDER_SITE_OTHER): Payer: BLUE CROSS/BLUE SHIELD | Admitting: Family Medicine

## 2017-02-24 VITALS — BP 122/80 | HR 62 | Resp 12 | Ht 71.0 in | Wt 220.0 lb

## 2017-02-24 DIAGNOSIS — K219 Gastro-esophageal reflux disease without esophagitis: Secondary | ICD-10-CM

## 2017-02-24 DIAGNOSIS — I10 Essential (primary) hypertension: Secondary | ICD-10-CM

## 2017-02-24 DIAGNOSIS — E782 Mixed hyperlipidemia: Secondary | ICD-10-CM | POA: Diagnosis not present

## 2017-02-24 DIAGNOSIS — M7712 Lateral epicondylitis, left elbow: Secondary | ICD-10-CM

## 2017-02-24 LAB — LDL CHOLESTEROL, DIRECT: Direct LDL: 39 mg/dL

## 2017-02-24 LAB — BASIC METABOLIC PANEL
BUN: 14 mg/dL (ref 6–23)
CO2: 31 meq/L (ref 19–32)
Calcium: 9.5 mg/dL (ref 8.4–10.5)
Chloride: 102 mEq/L (ref 96–112)
Creatinine, Ser: 1.09 mg/dL (ref 0.40–1.50)
GFR: 94.04 mL/min (ref >60.00)
Glucose, Bld: 95 mg/dL (ref 70–99)
Potassium: 4.3 mEq/L (ref 3.5–5.1)
SODIUM: 137 meq/L (ref 135–145)

## 2017-02-24 LAB — LIPID PANEL
Cholesterol: 207 mg/dL — ABNORMAL HIGH (ref 0–200)
HDL: 28.7 mg/dL — ABNORMAL LOW (ref >39.00)
Total CHOL/HDL Ratio: 7
Triglycerides: 440 mg/dL — ABNORMAL HIGH (ref 0.0–149.0)

## 2017-02-24 MED ORDER — OMEPRAZOLE 40 MG PO CPDR: 40.0000 mg | DELAYED_RELEASE_CAPSULE | Freq: Every day | ORAL | 2 refills | Status: DC

## 2017-02-24 MED ORDER — TRAMADOL HCL 50 MG PO TABS: 50.0000 mg | ORAL_TABLET | Freq: Two times a day (BID) | ORAL | 0 refills | Status: AC | PRN

## 2017-02-24 NOTE — Patient Instructions (Signed)
A few things to remember from today's visit:   Essential hypertension - Plan: Basic metabolic panel  Gastroesophageal reflux disease, esophagitis presence not specified - Plan: omeprazole (PRILOSEC) 40 MG capsule  Lateral epicondylitis of left elbow - Plan: traMADol (ULTRAM) 50 MG tablet, Ambulatory referral to Physical Therapy  Mixed hyperlipidemia - Plan: Lipid panel  Local ice. Stop Crestor.  Co Q10 may help. Please be sure medication list is accurate. If a new problem present, please set up appointment sooner than planned today.

## 2017-02-28 ENCOUNTER — Encounter: Payer: Self-pay | Admitting: Family Medicine

## 2017-02-28 MED ORDER — AMLODIPINE BESYLATE 5 MG PO TABS: 5.0000 mg | ORAL_TABLET | Freq: Every day | ORAL | 2 refills | Status: DC

## 2017-02-28 MED ORDER — GEMFIBROZIL 600 MG PO TABS: 600.0000 mg | ORAL_TABLET | Freq: Two times a day (BID) | ORAL | 1 refills | Status: DC

## 2017-03-02 ENCOUNTER — Telehealth: Payer: Self-pay | Admitting: Family Medicine

## 2017-03-02 MED ORDER — ESOMEPRAZOLE MAGNESIUM 20 MG PO CPDR: DELAYED_RELEASE_CAPSULE | ORAL | 3 refills | Status: DC

## 2017-03-02 NOTE — Telephone Encounter (Signed)
Rx sent 

## 2017-03-02 NOTE — Telephone Encounter (Signed)
It is ok to change to Esomeprazole 20 mg daily before breakfast.  Thanks, BJ

## 2017-03-02 NOTE — Telephone Encounter (Signed)
Received a fax from the pharmacy.  Omeprazole is no longer covered.  Insurance will cover esomeprazole.

## 2017-03-02 NOTE — Telephone Encounter (Signed)
Okay to switch to Esomeprazole?

## 2017-03-04 ENCOUNTER — Telehealth: Payer: Self-pay | Admitting: Family Medicine

## 2017-03-04 MED ORDER — OMEPRAZOLE 20 MG PO CPDR: 20.0000 mg | DELAYED_RELEASE_CAPSULE | Freq: Every day | ORAL | 3 refills | Status: DC

## 2017-03-04 NOTE — Telephone Encounter (Signed)
Rx sent 

## 2017-03-04 NOTE — Telephone Encounter (Signed)
Okay to switch back to omeprazole or would you like to use one of the other options?

## 2017-03-04 NOTE — Telephone Encounter (Signed)
Received a fax from the pharmacy.  Esomeprazole is not covered.  Requesting alternative medication.  Pharmacy suggests pantoprazole 40 mg tab, lansoprazole 30 mg capsule, rabeprazole 20 mg tab or omeprazole 20 mg capsule.  Please advise.

## 2017-03-04 NOTE — Telephone Encounter (Signed)
If symptoms were well controlled with Omeprazole and it is covered I recommend continuing same dose. Thanks

## 2017-03-06 ENCOUNTER — Encounter: Payer: Self-pay | Admitting: Physical Therapy

## 2017-03-06 ENCOUNTER — Ambulatory Visit: Payer: BLUE CROSS/BLUE SHIELD | Attending: Family Medicine | Admitting: Physical Therapy

## 2017-03-06 DIAGNOSIS — M6281 Muscle weakness (generalized): Secondary | ICD-10-CM | POA: Insufficient documentation

## 2017-03-06 DIAGNOSIS — M25522 Pain in left elbow: Secondary | ICD-10-CM

## 2017-03-06 DIAGNOSIS — M62838 Other muscle spasm: Secondary | ICD-10-CM | POA: Insufficient documentation

## 2017-03-06 NOTE — Therapy (Signed)
[facility] [address] [place], [place], [address] Phone: [phone]   Fax:  [phone]  Physical Therapy Evaluation  Patient Details  Name: Max Spencer MRN: [id number] Date of Birth: 1971-04-23 Referring Provider: Betty G SwazilandJordan MD  Encounter Date: 03/06/2017      PT End of Session - 03/06/17 1043    Visit Number 1   Number of Visits 8   Date for PT Re-Evaluation 05/01/17   PT Start Time 0931   PT Stop Time 1016   PT Time Calculation (min) 45 min   Activity Tolerance Patient tolerated treatment well   Behavior During Therapy [facility] for tasks assessed/performed      Past Medical History:  Diagnosis Date  . Chronic sinus infection   . 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 High blood pressure   . Hyperlipidemia   . Hypertension     Past Surgical History:  Procedure Laterality Date  . PILONIDAL CYST EXCISION    . PILONIDAL CYST EXCISION  2007  . SINUS ENDO W/FUSION N/A 05/27/2013   Procedure: ENDOSCOPIC SINUS SURGERY WITH FUSION NAVIGATION;  Surgeon: Suzanna ObeyJohn Byers, MD;  Location: [facility];  Service: ENT;  Laterality: N/A;  . WISDOM TOOTH EXTRACTION      There were no vitals filed for this visit.       Subjective Assessment - 03/06/17 0936    Subjective pt is a 46 y.o m with CC of L elbow pain that started in mid to late april that occurred with non-traumtic onset, with report of exacerbation doing a fishing tournament. Since onset the pain seems to fluctuate, reportes mostly pain in the elbow and forearm. No hx or L elbow or arm problems.    Limitations Lifting   How long can you sit comfortably? unlimited   How long can you stand comfortably? unlimited   How long can you walk comfortably? unlimited   Diagnostic tests N/A   Patient Stated Goals heal it, decrease pain, know what do, return fishing, decrease with pain driving.    Currently in Pain? Yes   Pain Score 2   at worst 10/10    Pain Location Elbow   Pain Orientation Left   Pain Descriptors / Indicators Tightness;Sore   Pain Type Chronic pain   Pain Onset More than a month ago   Pain Frequency Intermittent   Aggravating Factors  reaching/ grabbing, finger movement,    Pain Relieving Factors medicine, resting/ keeping it still            [facility] - 03/06/17 0942      Assessment   Medical Diagnosis L lateral epicondlylitis   Referring Provider Betty G SwazilandJordan MD   Onset Date/Surgical Date --  Mid Apil 2018   Hand Dominance Left   Next MD Visit --  6 months   Prior Therapy no     Precautions   Precautions None     Restrictions   Weight Bearing Restrictions No     Balance Screen   Has the patient fallen in the past 6 months No   Has the patient had a decrease in activity level because of a fear of falling?  No   Is the patient reluctant to leave their home because of a fear of falling?  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   Vocation Requirements lifting/ pushing, carrying      Cognition   Overall Cognitive Status Within Functional Limits for tasks assessed     Observation/Other Assessments   Focus on Therapeutic Outcomes (FOTO)  40% limited  predicted 26% limited     Posture/Postural Control   Posture/Postural Control Postural limitations   Postural Limitations Rounded Shoulders;Forward head     ROM / Strength   AROM / PROM / Strength AROM;PROM;Strength     AROM   AROM  Assessment Site Elbow;Wrist;Forearm   Right/Left Elbow Right;Left   Right Elbow Flexion 135   Right Elbow Extension 0   Left Elbow Flexion 142   Left Elbow Extension -5  ERP achiness   Left Forearm Pronation 90 Degrees   Left Forearm Supination 90 Degrees   Right/Left Wrist Left   Right Wrist Extension 74 Degrees   Right Wrist Flexion 76 Degrees   Right Wrist Radial Deviation 32 Degrees   Right Wrist Ulnar Deviation 28 Degrees   Left Wrist Extension 72 Degrees  soreness during movement   Left Wrist Flexion 74 Degrees   Left Wrist Radial Deviation 28 Degrees   Left Wrist Ulnar Deviation 32 Degrees     PROM   PROM Assessment Site Elbow;Wrist   Right/Left Elbow Left     Strength   Strength Assessment Site Elbow;Wrist;Forearm;Hand   Right/Left Elbow Right;Left   Right Elbow Flexion 4+/5   Right Elbow Extension 4+/5   Left Elbow Flexion 4/5   Left Elbow Extension 4/5  pain during testing   Right/Left Forearm Right;Left   Right Forearm Pronation 5/5   Right Forearm Supination 5/5   Left Forearm Pronation 4-/5  pain during testing   Left Forearm Supination 4-/5   Right/Left Wrist Right;Left   Right Wrist Flexion 5/5   Right Wrist Extension 5/5   Right Wrist Radial Deviation 5/5   Right Wrist Ulnar Deviation 5/5   Left Wrist Flexion 4/5  pain during testing   Left Wrist Extension 5/5   Left Wrist Radial Deviation 4+/5  pain during testing   Left Wrist Ulnar Deviation 5/5   Right/Left hand Right;Left   Right Hand Grip (lbs) 85.6  82,89,86   Left Hand Grip (lbs) 73.3  82,69,69     Palpation   Palpation comment TTP at the lateral epicondyle, common extensor muscle, supinator on L     Special Tests    Special Tests --  cozen test (+) resisted 3rd digit extension (+)            Objective measurements completed on examination: See above findings.          [facility] Adult PT Treatment/Exercise - 03/06/17 0942      Wrist Exercises   Other wrist exercises  wrist extensor stretch 2 x 30 sec     Modalities   Modalities Iontophoresis     Iontophoresis   Type of Iontophoresis Dexamethasone   Location L lateral epicondy   Dose 1cc   Time 6 hour stat patch     Manual Therapy   Manual Therapy Soft tissue mobilization  Soft tissue mobilization IASTM over the L common extensors          Trigger Point Dry Needling - 03/06/17 1044    Consent Given? Yes   Education Handout Provided Yes   Muscles Treated Upper Body --  common extensors on the L and supinator              PT Education - 03/06/17 1042    Education provided Yes   Education Details evaluation findings, POC, goals, HEP with proper form/ rationale. anatomy of muscles and referral patterns, what TPDN is, benefits/ what to expect and after care. Ionto education  provided before and during DN treatment   Person(s) Educated Patient   Methods Explanation;Verbal cues;Handout;Demonstration   Comprehension Verbalized understanding;Verbal cues required;Returned demonstration          PT Short Term Goals - 03/06/17 1225      PT SHORT TERM GOAL #1   Title pt will be I with inital HEP (04/05/2017)   Time 4   Period Weeks   Status New     PT SHORT TERM GOAL #2   Title pt to demonstrate reduced commone extensor/supinator tightness to reduce pain to </= 1/10 and promote wrist/ elbow mobility (05/06/2017)   Time 4   Period Weeks   Status New           PT Long Term Goals - 03/06/17 1225      PT LONG TERM GOAL #1   Title increase L wrist / elbow strength to >/= 4+/5 in all planes with </= 1/10 to provide wrist/ elbow support and assist with lifting activities(05/06/2017)   Time 8   Period Weeks   Status New     PT LONG TERM GOAL #2   Title increase grip strength in L to >/= 85# with no pain during testing for lifting/ holding and manipulating items for work and ADLs (05/06/2017)   Time 8   Period Weeks   Status New     PT LONG TERM GOAL #3   Title pt will be able  to lift/ carry >/= 10# with LUE reporting </=1/10 pain in the elbow for functional work related tasks and ADLs (05/06/2017)   Time 8   Period Weeks   Status New     PT LONG TERM GOAL #4   Title increase FOTO score to </= 26% limited to demo improvement in function (05/06/2017)   Time 8   Period Weeks   Status New     PT LONG TERM GOAL #5   Title pt to be I with all HEP given as of last visit (05/06/2017)   Time 8   Period Weeks   Status New                Plan - 03/06/17 1046    Clinical Impression Statement Max Spencer presents to OPPT with CC of L elbow pain that occurred starting mid April 2018 with insidiously, pain has remained the same since onset. He demonstrates functional ROM in all planes with mild limitation with L elbow extension. weakness in all planes in the L compared R with pain during resisted extension, radial deviaion and pronation. DN was explained and performed on the common extensors and supinator on the L, and iontophoresis was applied. pt would benefit from physical therapy to decrease pain, redcue muscle tensio, improve wrist/ elbow strength, improve grip and maxmize function by addressing the deficits listed.    Clinical Presentation Stable   Clinical Decision  Making Low   Rehab Potential Good   PT Frequency 1x / week   PT Duration 8 weeks   PT Treatment/Interventions ADLs/Self Care Home Management;Cryotherapy;Electrical Stimulation;Iontophoresis 4mg /ml Dexamethasone;Moist Heat;Ultrasound;Dry needling;Taping;Manual techniques;Passive range of motion;Patient/family education;Therapeutic activities;Therapeutic exercise   PT Next Visit Plan assess/ review HEP and update PRN, assess response to DN/ ionto. soft tissue work , lateral elbow mobs with movement, continue ionto,    PT Home Exercise Plan wrist flexion / extension stretching, towel grip   Consulted and Agree with Plan of Care Patient      Patient will benefit from skilled therapeutic intervention  in order to improve the following deficits and impairments:  Pain, Improper body mechanics, Postural dysfunction, Decreased strength, Increased fascial restricitons, Decreased endurance, Decreased activity tolerance  Visit Diagnosis: Pain in left elbow - Plan: PT plan of care cert/re-cert  Muscle weakness (generalized) - Plan: PT plan of care cert/re-cert  Other muscle spasm - Plan: PT plan of care cert/re-cert     Problem List Patient Active Problem List   Diagnosis Date Noted  . GERD (gastroesophageal reflux disease) 08/26/2016  . Hyperlipidemia 04/06/2014  . Heart palpitations 12/27/2012  . Essential hypertension 12/27/2012   Lulu Riding PT, DPT, LAT, ATC  03/06/17  12:38 PM      [facility] [address] [place], [place], [address] Phone: [phone]   Fax:  [phone]  Name: Max Spencer MRN: [id number] Date of Birth: Jun 14, 1971

## 2017-03-19 ENCOUNTER — Ambulatory Visit: Payer: BLUE CROSS/BLUE SHIELD | Admitting: Physical Therapy

## 2017-03-26 ENCOUNTER — Encounter: Payer: Self-pay | Admitting: Physical Therapy

## 2017-03-26 ENCOUNTER — Ambulatory Visit: Payer: BLUE CROSS/BLUE SHIELD | Attending: Family Medicine | Admitting: Physical Therapy

## 2017-03-26 DIAGNOSIS — M62838 Other muscle spasm: Secondary | ICD-10-CM

## 2017-03-26 DIAGNOSIS — M6281 Muscle weakness (generalized): Secondary | ICD-10-CM | POA: Diagnosis not present

## 2017-03-26 DIAGNOSIS — M25522 Pain in left elbow: Secondary | ICD-10-CM | POA: Diagnosis not present

## 2017-03-26 NOTE — Therapy (Signed)
[facility] [address] [place], [place], [address] Phone: [phone]   Fax:  [phone]  Physical Therapy Treatment  Patient Details  Name: Max PlumberWesley L Ghanem MRN: [id number] Date of Birth: [birth year] Referring Provider: Betty G SwazilandJordan MD  Encounter Date: 03/26/2017      PT End of Session - 03/26/17 0932    Visit Number 2   Number of Visits 8   Date for PT Re-Evaluation 05/01/17   PT Start Time 0932   PT Stop Time 1014   PT Time Calculation (min) 42 min   Activity Tolerance Patient tolerated treatment well   Behavior During Therapy [facility] for tasks assessed/performed      Past Medical History:  Diagnosis Date  . Chronic sinus infection   . 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 High blood pressure   . Hyperlipidemia   . Hypertension     Past Surgical History:  Procedure Laterality Date  . PILONIDAL CYST EXCISION    . PILONIDAL CYST EXCISION  2007  . SINUS ENDO W/FUSION N/A 05/27/2013   Procedure: ENDOSCOPIC SINUS SURGERY WITH FUSION NAVIGATION;  Surgeon: Suzanna ObeyJohn Byers, MD;  Location: [facility];  Service: ENT;  Laterality: N/A;  . WISDOM TOOTH EXTRACTION      There were no vitals filed for this visit.      Subjective Assessment - 03/26/17 0932    Subjective Arm feels about the same after last visit. Some good days, some bad. Pain noted in reaching and grabbing from fridge.    Currently in Pain? Yes   Pain Score 6    Pain Location Elbow   Pain Orientation Left;Lateral   Pain Descriptors / Indicators Aching;Sore   Aggravating Factors  reaching/grabbing, carrying bag, moving 3/4/5th digits                         [facility] Adult PT Treatment/Exercise - 03/26/17 0001      Wrist Exercises   Other wrist exercises flexion/ext stretch on table     Manual Therapy   Manual Therapy Taping;Joint mobilization   Joint Mobilization humeral-ulnar distraction, proximal radial  mobilizations, mobilizations with movement   Soft tissue mobilization IASTM & manual STM to wrist flexors and exentsor groups   Kinesiotex Inhibit Muscle     Kinesiotix   Inhibit Muscle  L wrist extensors                PT Education - 03/26/17 1018    Education provided Yes   Education Details rationale for manual treatment, overuse injuries & options to compensations, use of roller to muscle groups, frequency of stretching rather than waiting for pain   Person(s) Educated Patient   Methods Explanation;Demonstration;Tactile cues;Verbal cues   Comprehension Verbalized understanding;Returned demonstration;Verbal cues required;Tactile cues required;Need further instruction          PT Short Term Goals - 03/06/17 1225      PT SHORT TERM GOAL #1   Title pt will be I with inital HEP (04/05/2017)   Time 4   Period Weeks   Status New     PT SHORT TERM GOAL #2   Title pt to demonstrate reduced commone extensor/supinator tightness to reduce pain to </= 1/10 and promote wrist/ elbow mobility (05/06/2017)   Time 4   Period Weeks   Status New           PT Long Term Goals - 03/06/17 1225  PT LONG TERM GOAL #1   Title increase L wrist / elbow strength to >/= 4+/5 in all planes with </= 1/10 to provide wrist/ elbow support and assist with lifting activities(05/06/2017)   Time 8   Period Weeks   Status New     PT LONG TERM GOAL #2   Title increase grip strength in L to >/= 85# with no pain during testing for lifting/ holding and manipulating items for work and ADLs (05/06/2017)   Time 8   Period Weeks   Status New     PT LONG TERM GOAL #3   Title pt will be able to lift/ carry >/= 10# with LUE reporting </=1/10 pain in the elbow for functional work related tasks and ADLs (05/06/2017)   Time 8   Period Weeks   Status New     PT LONG TERM GOAL #4   Title increase FOTO score to </= 26% limited to demo improvement in function (05/06/2017)   Time 8   Period Weeks    Status New     PT LONG TERM GOAL #5   Title pt to be I with all HEP given as of last visit (05/06/2017)   Time 8   Period Weeks   Status New             Patient will benefit from skilled therapeutic intervention in order to improve the following deficits and impairments:     Visit Diagnosis: Pain in left elbow  Muscle weakness (generalized)  Other muscle spasm     Problem List Patient Active Problem List   Diagnosis Date Noted  . GERD (gastroesophageal reflux disease) 08/26/2016  . Hyperlipidemia 04/06/2014  . Heart palpitations 12/27/2012  . Essential hypertension 12/27/2012    Max Spencer C. Max Spencer PT, DPT 03/26/17 10:21 AM   [facility] [address] [place], [place], [address] Phone: [phone]   Fax:  [phone]  Name: Max Spencer MRN: [id number] Date of Birth: 01-Feb-1971

## 2017-04-02 ENCOUNTER — Encounter: Payer: BLUE CROSS/BLUE SHIELD | Admitting: Physical Therapy

## 2017-04-09 ENCOUNTER — Encounter: Payer: Self-pay | Admitting: Physical Therapy

## 2017-04-09 ENCOUNTER — Ambulatory Visit: Payer: BLUE CROSS/BLUE SHIELD | Attending: Family Medicine | Admitting: Physical Therapy

## 2017-04-09 DIAGNOSIS — M62838 Other muscle spasm: Secondary | ICD-10-CM | POA: Insufficient documentation

## 2017-04-09 DIAGNOSIS — M6281 Muscle weakness (generalized): Secondary | ICD-10-CM | POA: Diagnosis not present

## 2017-04-09 DIAGNOSIS — M25522 Pain in left elbow: Secondary | ICD-10-CM | POA: Insufficient documentation

## 2017-04-09 NOTE — Therapy (Addendum)
[place], [place], [address] Phone: [phone]   Fax:  [phone]  Physical Therapy Treatment  Patient Details  Name: Max Spencer MRN: [id number] Date of Birth: Aug 27, 1971 Referring Provider: Betty G Martinique MD  Encounter Date: 04/09/2017      PT End of Session - 04/09/17 1027    Visit Number 3   Number of Visits 8   Date for PT Re-Evaluation 05/01/17   PT Start Time 0930   PT Stop Time 1023   PT Time Calculation (min) 53 min   Activity Tolerance Patient tolerated treatment well   Behavior During Therapy [facility] for tasks assessed/performed      Past Medical History:  Diagnosis Date  . Chronic sinus infection   . 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 High blood pressure   . Hyperlipidemia   . Hypertension     Past Surgical History:  Procedure Laterality Date  . PILONIDAL CYST EXCISION    . PILONIDAL CYST EXCISION  2007  . SINUS ENDO W/FUSION N/A 05/27/2013   Procedure: ENDOSCOPIC SINUS SURGERY WITH FUSION NAVIGATION;  Surgeon: Melissa Montane, MD;  Location: [facility];  Service: ENT;  Laterality: N/A;  . WISDOM TOOTH EXTRACTION      There were no vitals filed for this visit.      Subjective Assessment - 04/09/17 0931    Subjective "It was doing okay, some ups/ downs. I did work one night and hit it pretty good and it caused some issues"    Currently in Pain? Yes   Pain Score 0-No pain   Pain Orientation Left;Lateral   Pain Descriptors / Indicators Aching;Sore   Pain Type Chronic pain   Pain Onset More than a month ago   Pain Frequency Intermittent   Aggravating Factors  reaching/ grabbing, carrying   Pain Relieving Factors medicine, resting/ keeping it still.                          [facility] Adult PT Treatment/Exercise - 04/09/17 0949      Wrist Exercises   Other wrist exercises contract/ relax stretching for wrist extensors     Modalities    Modalities Moist Heat     Moist Heat Therapy   Number Minutes Moist Heat 10 Minutes   Moist Heat Location Elbow     Manual Therapy   Joint Mobilization humeral-ulnar distraction, proximal radial mobilizations, mobilizations with movement using belt and gripping   Soft tissue mobilization IASTM over wrist extensors and flexors   Kinesiotex Inhibit Muscle     Kinesiotix   Inhibit Muscle  L wrist extensors          Trigger Point Dry Needling - 04/09/17 0946    Consent Given? Yes   Education Handout Provided No   Muscles Treated Upper Body --  common extensors and supinator               PT Education - 04/09/17 1027    Education provided Yes   Education Details benefits of continued stretching throughout the day to promote pain relief and muscle relaxation.    Person(s) Educated Patient   Methods Explanation;Verbal cues   Comprehension Verbalized understanding;Verbal cues required          PT Short Term Goals - 04/09/17 1044      PT SHORT TERM GOAL #1   Title pt will be I with inital  HEP (04/05/2017)   Time 4   Period Weeks   Status Achieved           PT Long Term Goals - 03/06/17 1225      PT LONG TERM GOAL #1   Title increase L wrist / elbow strength to >/= 4+/5 in all planes with </= 1/10 to provide wrist/ elbow support and assist with lifting activities(05/06/2017)   Time 8   Period Weeks   Status New     PT LONG TERM GOAL #2   Title increase grip strength in L to >/= 85# with no pain during testing for lifting/ holding and manipulating items for work and ADLs (05/06/2017)   Time 8   Period Weeks   Status New     PT LONG TERM GOAL #3   Title pt will be able to lift/ carry >/= 10# with LUE reporting </=1/10 pain in the elbow for functional work related tasks and ADLs (05/06/2017)   Time 8   Period Weeks   Status New     PT LONG TERM GOAL #4   Title increase FOTO score to </= 26% limited to demo improvement in function (05/06/2017)   Time 8    Period Weeks   Status New     PT LONG TERM GOAL #5   Title pt to be I with all HEP given as of last visit (05/06/2017)   Time 8   Period Weeks   Status New               Plan - 04/09/17 1034    Clinical Impression Statement pt reported some relief of pain in the elbow since the last session. continued DN over the common extensor and supinator on the L. continued soft tissue technique folloing DN, continued elbows mobs and mobs with movement which he reported decreeased pain and tightness. continued KT taping to inhibit wrist extensors. MHP post session.    PT Next Visit Plan assess/ review HEP and update PRN, assess response to DN/ ionto. soft tissue work , lateral elbow mobs with movement, continue ionto PRN   PT Home Exercise Plan wrist flexion / extension stretching, towel grip   Consulted and Agree with Plan of Care Patient      Patient will benefit from skilled therapeutic intervention in order to improve the following deficits and impairments:  Pain, Improper body mechanics, Postural dysfunction, Decreased strength, Increased fascial restricitons, Decreased endurance, Decreased activity tolerance  Visit Diagnosis: Pain in left elbow  Muscle weakness (generalized)  Other muscle spasm     Problem List Patient Active Problem List   Diagnosis Date Noted  . GERD (gastroesophageal reflux disease) 08/26/2016  . Hyperlipidemia 04/06/2014  . Heart palpitations 12/27/2012  . Essential hypertension 12/27/2012   Starr Lake PT, DPT, LAT, ATC  04/09/17  11:00 AM      [facility] [address] [place], [place], [address] Phone: [phone]   Fax:  [phone]  Name: Max Spencer MRN: [id number] Date of Birth: [birth year]    PHYSICAL THERAPY DISCHARGE SUMMARY  Visits from Start of Care: 3  Current functional level related to goals / functional outcomes: See goals   Remaining deficits: unknown    Education / Equipment: HEP  Plan: Patient agrees to discharge.  Patient goals were not met. Patient is being discharged due to not returning since the last visit.  ?????     Chisom Aust PT, DPT, LAT, ATC  05/14/17  9:24  AM

## 2017-05-28 ENCOUNTER — Encounter: Payer: Self-pay | Admitting: Family Medicine

## 2017-05-28 ENCOUNTER — Encounter: Payer: Self-pay | Admitting: Cardiovascular Disease

## 2017-05-28 ENCOUNTER — Ambulatory Visit (INDEPENDENT_AMBULATORY_CARE_PROVIDER_SITE_OTHER): Payer: BLUE CROSS/BLUE SHIELD | Admitting: Cardiovascular Disease

## 2017-05-28 VITALS — BP 130/90 | HR 80 | Ht 71.75 in | Wt 216.0 lb

## 2017-05-28 DIAGNOSIS — I10 Essential (primary) hypertension: Secondary | ICD-10-CM

## 2017-05-28 DIAGNOSIS — E785 Hyperlipidemia, unspecified: Secondary | ICD-10-CM | POA: Diagnosis not present

## 2017-05-28 MED ORDER — OMEGA-3-ACID ETHYL ESTERS 1 G PO CAPS: 1.0000 | ORAL_CAPSULE | Freq: Two times a day (BID) | ORAL | 3 refills | Status: DC

## 2017-05-28 NOTE — Patient Instructions (Signed)
Medication Instructions:  1) LOVAZA 1g TWICE DAILY has been called in for you.  Labwork: Have your PCP follow up on fasting labs in a few months.  Testing/Procedures: None  Follow-Up: Your provider wants you to follow-up in: 1 year with Dr. Excell Seltzer. You will receive a reminder letter in the mail two months in advance. If you don't receive a letter, please call our office to schedule the follow-up appointment.    Any Other Special Instructions Will Be Listed Below (If Applicable).     If you need a refill on your cardiac medications before your next appointment, please call your pharmacy.

## 2017-05-28 NOTE — Progress Notes (Signed)
Cardiology Office Note Date:  05/28/2017   ID:  Max Spencer, Max Spencer 09-10-1970, MRN [id number]  PCP:  [place], Betty G, MD  Cardiologist:  Max Bollman, MD    Chief Complaint  Patient presents with  . Follow-up    1 year     History of Present Illness: Max Spencer is a 46 y.o. male who presents for Follow-up of hypertension and heart palpitations. Previous testing has included an echocardiogram and exercise treadmill study, both of which were normal in 2014.  He continues to have occasional symptomatic PVC's. Otherwise doing well. He swims for exercise. Had one episode when he pushed it hard and hadn't eaten before swimming. He swam 1800 meters and felt poorly when he was done. He complained of nausea and weakness, then recovered and felt ok. No CP, dyspnea, or other complaints.    Past Medical History:  Diagnosis Date  . Chronic sinus infection   . 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 High blood pressure   . Hyperlipidemia   . Hypertension     Past Surgical History:  Procedure Laterality Date  . PILONIDAL CYST EXCISION    . PILONIDAL CYST EXCISION  2007  . SINUS ENDO W/FUSION N/A 05/27/2013   Procedure: ENDOSCOPIC SINUS SURGERY WITH FUSION NAVIGATION;  Surgeon: Suzanna Obey, MD;  Location: [facility];  Service: ENT;  Laterality: N/A;  . WISDOM TOOTH EXTRACTION      Current Outpatient Prescriptions  Medication Sig Dispense Refill  . amLODipine (NORVASC) 5 MG tablet Take 5 mg by mouth every other day.    . omega-3 acid ethyl esters (LOVAZA) 1 Spencer capsule Take 1 capsule (1 Spencer total) by mouth 2 (two) times daily. 180 capsule 3  . omeprazole (PRILOSEC) 20 MG capsule Take 1 capsule (20 mg total) by mouth daily. 30 capsule 3  . rosuvastatin (CRESTOR) 20 MG tablet Take 20 mg by mouth every other day.     No current facility-administered medications for this visit.     Allergies:   Tetracyclines & related   Social History:  The patient  reports  that he has never smoked. He has never used smokeless tobacco. He reports that he does not drink alcohol or use drugs.   Family History:  The patient's family history includes Cancer in his maternal grandmother; Diabetes in his maternal grandfather; Hyperlipidemia in his father, maternal grandfather, and mother; Hypertension in his maternal grandmother; Sudden death in his father.    ROS:  Please see the history of present illness.  Otherwise, review of systems is positive for muscle pain, palpitations.  All other systems are reviewed and negative.   PHYSICAL EXAM: VS:  BP 130/90   Pulse 80   Ht 5' 11.75" (1.822 m)   Wt 98 kg (216 lb)   BMI 29.50 kg/m  , BMI Body mass index is 29.5 kg/m. GEN: Well nourished, well developed, in no acute distress  HEENT: normal  Neck: no JVD, no masses. No carotid bruits Cardiac: RRR without murmur or gallop                Respiratory:  clear to auscultation bilaterally, normal work of breathing GI: soft, nontender, nondistended, + BS MS: no deformity or atrophy  Ext: no pretibial edema, pedal pulses 2+= bilaterally Skin: warm and dry, no rash Neuro:  Strength and sensation are intact Psych: euthymic mood, full affect  EKG:  EKG is ordered today. The ekg ordered today shows  NSR 80 bpm, within normal limits  Recent Labs: 08/27/2016: ALT 26 02/24/2017: BUN 14; Creatinine, Ser 1.09; Potassium 4.3; Sodium 137   Lipid Panel     Component Value Date/Time   CHOL 207 (H) 02/24/2017 1042   TRIG (H) 02/24/2017 1042    440.0 Triglyceride is over 400; calculations on Lipids are invalid.   HDL 28.70 (L) 02/24/2017 1042   CHOLHDL 7 02/24/2017 1042   VLDL 28.2 08/27/2016 0945   LDLCALC 79 08/27/2016 0945   LDLDIRECT 39.0 02/24/2017 1042     Wt Readings from Last 3 Encounters:  05/28/17 98 kg (216 lb)  02/24/17 99.8 kg (220 lb)  08/26/16 98.9 kg (218 lb)    ASSESSMENT AND PLAN: 1.  Essential hypertension: Blood pressure is well controlled. He will  continue on his current medicines.  2. Heart palpitations: Benign pattern with no change.  3. Strong family history of premature coronary artery disease: We discussed maintenance antiplatelet therapy with low-dose aspirin but he has had hemorrhoidal bleeding problems when he is on this in the past. He will continue on a statin drug. We discussed consideration of a coronary CT calcium score which she will consider.  Current medicines are reviewed with the patient today.  The patient does not have concerns regarding medicines.  Labs/ tests ordered today include:   Orders Placed This Encounter  Procedures  . EKG 12-Lead   Disposition:   FU one year  Signed, Max Bollman, MD  05/28/2017 5:36 PM    [facility] [address] [place], [place], [place]  [address] Phone: [phone]; Fax: [phone]

## 2017-06-12 ENCOUNTER — Encounter: Payer: Self-pay | Admitting: Family Medicine

## 2017-08-25 NOTE — Progress Notes (Deleted)
Max Spencer Larrick is a 46 y.o.male, who is here today to follow on HTN and HLD.  He was last seen on 02/24/2017 due to acute visit.   Since his last visit he has follow-up with cardiologist, Dr. Excell Seltzerooper on 05/28/2017.  HTN:  Currently he is on Amlodipine 5 mg daily. ***taking medications as instructed, no side effects reported.  ***has not noted unusual headache, visual changes, exertional chest pain, dyspnea,  focal weakness, or edema.    Lab Results  Component Value Date   CREATININE 1.09 02/24/2017   BUN 14 02/24/2017   NA 137 02/24/2017   K 4.3 02/24/2017   CL 102 02/24/2017   CO2 31 02/24/2017     Hyperlipidemia:  Currently on Crestor 20 mg daily. Following a low fat diet: ***.  *** has not noted side effects with medication.  Lab Results  Component Value Date   CHOL 207 (H) 02/24/2017   HDL 28.70 (Spencer) 02/24/2017   LDLCALC 79 08/27/2016   LDLDIRECT 39.0 02/24/2017   TRIG (H) 02/24/2017    440.0 Triglyceride is over 400; calculations on Lipids are invalid.   CHOLHDL 7 02/24/2017      Review of Systems   Current Outpatient Medications on File Prior to Visit  Medication Sig Dispense Refill  . amLODipine (NORVASC) 5 MG tablet Take 5 mg by mouth every other day.    . omega-3 acid ethyl esters (LOVAZA) 1 g capsule Take 1 capsule (1 g total) by mouth 2 (two) times daily. 180 capsule 3  . omeprazole (PRILOSEC) 20 MG capsule Take 1 capsule (20 mg total) by mouth daily. 30 capsule 3  . rosuvastatin (CRESTOR) 20 MG tablet Take 20 mg by mouth every other day.     No current facility-administered medications on file prior to visit.      Past Medical History:  Diagnosis Date  . Chronic sinus infection   . 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 High blood pressure   . Hyperlipidemia   . Hypertension     Allergies  Allergen Reactions  . Tetracyclines & Related     vertigo    Social History   Socioeconomic History  . Marital status: Married     Spouse name: Not on file  . Number of children: Not on file  . Years of education: Not on file  . Highest education level: Not on file  Social Needs  . Financial resource strain: Not on file  . Food insecurity - worry: Not on file  . Food insecurity - inability: Not on file  . Transportation needs - medical: Not on file  . Transportation needs - non-medical: Not on file  Occupational History  . Not on file  Tobacco Use  . Smoking status: Never Smoker  . Smokeless tobacco: Never Used  Substance and Sexual Activity  . Alcohol use: No  . Drug use: No  . Sexual activity: Not Currently  Other Topics Concern  . Not on file  Social History Narrative   ** Merged History Encounter **       He is married. He does not smoke cigarettes or drink alcohol. His children are ages 2620, 2516, and 443. 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    There were no vitals filed for this visit. There is no height or weight on file to calculate BMI.    Physical Exam  ASSESSMENT AND PLAN:  Diagnoses and all orders for this visit:  Essential hypertension      -Max Spencer Burkel advised to return sooner than planned today if new concerns arise.     Sung Parodi G. Swazil[place]Jordan, MD  [facility]. [place] office.

## 2017-08-26 ENCOUNTER — Ambulatory Visit: Payer: BLUE CROSS/BLUE SHIELD | Admitting: Family Medicine

## 2018-01-04 DIAGNOSIS — J019 Acute sinusitis, unspecified: Secondary | ICD-10-CM | POA: Diagnosis not present

## 2018-03-22 ENCOUNTER — Other Ambulatory Visit: Payer: Self-pay | Admitting: Cardiovascular Disease

## 2018-03-22 DIAGNOSIS — E785 Hyperlipidemia, unspecified: Secondary | ICD-10-CM

## 2018-03-22 DIAGNOSIS — I10 Essential (primary) hypertension: Secondary | ICD-10-CM

## 2018-04-26 DIAGNOSIS — E781 Pure hyperglyceridemia: Secondary | ICD-10-CM | POA: Diagnosis not present

## 2018-04-26 DIAGNOSIS — I10 Essential (primary) hypertension: Secondary | ICD-10-CM | POA: Diagnosis not present

## 2018-04-26 DIAGNOSIS — E7800 Pure hypercholesterolemia, unspecified: Secondary | ICD-10-CM | POA: Diagnosis not present

## 2018-04-26 DIAGNOSIS — R61 Generalized hyperhidrosis: Secondary | ICD-10-CM | POA: Diagnosis not present

## 2018-05-11 ENCOUNTER — Other Ambulatory Visit: Payer: Self-pay | Admitting: Cardiovascular Disease

## 2018-05-11 DIAGNOSIS — R61 Generalized hyperhidrosis: Secondary | ICD-10-CM | POA: Diagnosis not present

## 2018-05-11 DIAGNOSIS — I10 Essential (primary) hypertension: Secondary | ICD-10-CM

## 2018-06-21 ENCOUNTER — Other Ambulatory Visit: Payer: Self-pay | Admitting: Cardiovascular Disease

## 2018-06-21 DIAGNOSIS — E785 Hyperlipidemia, unspecified: Secondary | ICD-10-CM

## 2018-07-23 ENCOUNTER — Ambulatory Visit: Payer: 59 | Admitting: Cardiovascular Disease

## 2018-07-23 ENCOUNTER — Encounter: Payer: Self-pay | Admitting: Cardiovascular Disease

## 2018-07-23 VITALS — BP 122/88 | HR 57 | Ht 71.75 in | Wt 212.6 lb

## 2018-07-23 DIAGNOSIS — E782 Mixed hyperlipidemia: Secondary | ICD-10-CM

## 2018-07-23 DIAGNOSIS — I10 Essential (primary) hypertension: Secondary | ICD-10-CM | POA: Diagnosis not present

## 2018-07-23 DIAGNOSIS — N522 Drug-induced erectile dysfunction: Secondary | ICD-10-CM

## 2018-07-23 LAB — COMPREHENSIVE METABOLIC PANEL
A/G RATIO: 1.7 (ref 1.2–2.2)
ALK PHOS: 71 IU/L (ref 39–117)
ALT: 26 IU/L (ref 0–44)
AST: 21 IU/L (ref 0–40)
Albumin: 4.3 g/dL (ref 3.5–5.5)
BUN/Creatinine Ratio: 10 (ref 9–20)
BUN: 11 mg/dL (ref 6–24)
Bilirubin Total: 0.4 mg/dL (ref 0.0–1.2)
CALCIUM: 9.6 mg/dL (ref 8.7–10.2)
CO2: 26 mmol/L (ref 20–29)
Chloride: 99 mmol/L (ref 96–106)
Creatinine, Ser: 1.13 mg/dL (ref 0.76–1.27)
GFR calc Af Amer: 90 mL/min/{1.73_m2} (ref >59)
GFR, EST NON AFRICAN AMERICAN: 78 mL/min/{1.73_m2} (ref >59)
GLOBULIN, TOTAL: 2.6 g/dL (ref 1.5–4.5)
Glucose: 100 mg/dL — ABNORMAL HIGH (ref 65–99)
POTASSIUM: 4.3 mmol/L (ref 3.5–5.2)
SODIUM: 139 mmol/L (ref 134–144)
Total Protein: 6.9 g/dL (ref 6.0–8.5)

## 2018-07-23 LAB — LIPID PANEL
Chol/HDL Ratio: 3.7 ratio (ref 0.0–5.0)
Cholesterol, Total: 144 mg/dL (ref 100–199)
HDL: 39 mg/dL — AB (ref >39)
LDL Calculated: 63 mg/dL (ref 0–99)
TRIGLYCERIDES: 208 mg/dL — AB (ref 0–149)
VLDL CHOLESTEROL CAL: 42 mg/dL — AB (ref 5–40)

## 2018-07-23 MED ORDER — SILDENAFIL CITRATE 20 MG PO TABS: ORAL_TABLET | ORAL | 3 refills | Status: DC

## 2018-07-23 MED ORDER — AMLODIPINE BESYLATE 10 MG PO TABS: 10.0000 mg | ORAL_TABLET | Freq: Every day | ORAL | 3 refills | Status: DC

## 2018-07-23 NOTE — Progress Notes (Signed)
Cardiology Office Note:    Date:  07/23/2018   ID:  [name], DOB 11/29/1970, MRN [id number]  PCP:  Max Bussing, MD  Cardiologist:  Tonny Bollman, MD  Electrophysiologist:  None   Referring MD: Max, Betty G, MD   Chief Complaint  Patient presents with  . Hypertension    History of Present Illness:    Max Spencer is a 47 y.o. male with a hx of hypertension and heart palpitations, presenting for follow-up evaluation.  The patient underwent echo and exercise treadmill studies in 2014, both of which were essentially normal.  The patient is here with his wife today.  Since I saw him last, he has had 2 episodes of presyncope.  Both of been when he got "overheated."  This is been worse since he has started hydrochlorothiazide.  He has been on amlodipine 5 mg for some time.  His blood pressures were increasing and consistently greater than 140 mmHg so HCTZ 12.5 mg was added.  He is had no chest pain or pressure.  He is physically active with no exertional symptoms.  He denies shortness of breath, edema, orthopnea, or PND.  He continues to have rare heart palpitations.  He has had some problems with erectile dysfunction since adding hydrochlorothiazide to amlodipine.  Past Medical History:  Diagnosis Date  . Chronic sinus infection   . 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 High blood pressure   . Hyperlipidemia   . Hypertension     Past Surgical History:  Procedure Laterality Date  . PILONIDAL CYST EXCISION    . PILONIDAL CYST EXCISION  2007  . SINUS ENDO W/FUSION N/A 05/27/2013   Procedure: ENDOSCOPIC SINUS SURGERY WITH FUSION NAVIGATION;  Surgeon: Suzanna Obey, MD;  Location: [facility];  Service: ENT;  Laterality: N/A;  . WISDOM TOOTH EXTRACTION      Current Medications: Current Meds  Medication Sig  . amLODipine (NORVASC) 10 MG tablet Take 1 tablet (10 mg total) by mouth daily.  Marland Kitchen omega-3 acid ethyl esters (LOVAZA) 1 Spencer capsule Take 1  capsule (1 Spencer total) by mouth 2 (two) times daily.  . rosuvastatin (CRESTOR) 20 MG tablet Take 1 tablet (20 mg total) by mouth daily. Please keep upcoming appt with Dr. Excell Seltzer for future refills.  . [DISCONTINUED] amLODipine (NORVASC) 5 MG tablet TAKE 1 TABLET (5 MG TOTAL) BY MOUTH EVERY OTHER DAY.  . [DISCONTINUED] hydrochlorothiazide (HYDRODIURIL) 12.5 MG tablet Take 12.5 mg by mouth daily.     Allergies:   Tetracyclines & related   Social History   Socioeconomic History  . Marital status: Married    Spouse name: Not on file  . Number of children: Not on file  . Years of education: Not on file  . Highest education level: Not on file  Occupational History  . Not on file  Social Needs  . Financial resource strain: Not on file  . Food insecurity:    Worry: Not on file    Inability: Not on file  . Transportation needs:    Medical: Not on file    Non-medical: Not on file  Tobacco Use  . Smoking status: Never Smoker  . Smokeless tobacco: Never Used  Substance and Sexual Activity  . Alcohol use: No  . Drug use: No  . Sexual activity: Not Currently  Lifestyle  . Physical activity:    Days per week: Not on file    Minutes per session: Not on file  .  Stress: Not on file  Relationships  . Social connections:    Talks on phone: Not on file    Gets together: Not on file    Attends religious service: Not on file    Active member of club or organization: Not on file    Attends meetings of clubs or organizations: Not on file    Relationship status: Not on file  Other Topics Concern  . Not on file  Social History Narrative   ** Merged History Encounter **       He is married. He does not smoke cigarettes or drink alcohol. His children are ages 74, 65, and 73. 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     Family History: The patient's family history includes Cancer in his maternal grandmother; Diabetes in his maternal grandfather;  Hyperlipidemia in his father, maternal grandfather, and mother; Hypertension in his maternal grandmother; Sudden death in his father.  ROS:   Please see the history of present illness.    All other systems reviewed and are negative.  EKGs/Labs/Other Studies Reviewed:    The following studies were reviewed today: Echocardiogram 01/04/2013: Study Conclusions  Left ventricle: The cavity size was normal. Wall thickness was increased in a pattern of mild LVH. The estimated ejection fraction was 65%. Wall motion was normal; there were no regional wall motion abnormalities. Left ventricular diastolic function parameters were normal.  EKG:  EKG is ordered today.  The ekg ordered today demonstrates sinus bradycardia 57 bpm, otherwise normal.  Recent Labs: No results found for requested labs within last 8760 hours.  Recent Lipid Panel    Component Value Date/Time   CHOL 207 (H) 02/24/2017 1042   TRIG (H) 02/24/2017 1042    440.0 Triglyceride is over 400; calculations on Lipids are invalid.   HDL 28.70 (L) 02/24/2017 1042   CHOLHDL 7 02/24/2017 1042   VLDL 28.2 08/27/2016 0945   LDLCALC 79 08/27/2016 0945   LDLDIRECT 39.0 02/24/2017 1042    Physical Exam:    VS:  BP 122/88   Pulse (!) 57   Ht 5' 11.75" (1.822 m)   Wt 212 lb 9.6 oz (96.4 kg)   SpO2 99%   BMI 29.04 kg/m     Wt Readings from Last 3 Encounters:  07/23/18 212 lb 9.6 oz (96.4 kg)  05/28/17 216 lb (98 kg)  02/24/17 220 lb (99.8 kg)     GEN:  Well nourished, well developed in no acute distress HEENT: Normal NECK: No JVD; No carotid bruits LYMPHATICS: No lymphadenopathy CARDIAC: RRR, no murmurs, rubs, gallops RESPIRATORY:  Clear to auscultation without rales, wheezing or rhonchi  ABDOMEN: Soft, non-tender, non-distended MUSCULOSKELETAL:  No edema; No deformity  SKIN: Warm and dry NEUROLOGIC:  Alert and oriented x 3 PSYCHIATRIC:  Normal affect   ASSESSMENT:    1. Mixed hyperlipidemia   2. Essential  hypertension   3. Drug-induced erectile dysfunction    PLAN:    In order of problems listed above:  1. He continues on rosuvastatin and Lovaza.  He follows a good lifestyle.  Will update his lipid panel.  We will check LFTs. 2. Blood pressure is controlled but he is experiencing side effects from HCTZ.  Will try amlodipine 10 mg and discontinuation of HCTZ.  He will continue to monitor.  Check metabolic panel today. 3. Likely related to his antihypertensive medications.  Prescription written for sildenafil. 4. The patient has a family history of premature  CAD as his father died of an MI at 46 years old.  We will check a coronary calcium CT.  He is on appropriate risk reduction with low-dose aspirin and a statin drug.  Medication Adjustments/Labs and Tests Ordered: Current medicines are reviewed at length with the patient today.  Concerns regarding medicines are outlined above.  Orders Placed This Encounter  Procedures  . CT CARDIAC SCORING  . Lipid panel  . Comprehensive metabolic panel  . EKG 12-Lead   Meds ordered this encounter  Medications  . amLODipine (NORVASC) 10 MG tablet    Sig: Take 1 tablet (10 mg total) by mouth daily.    Dispense:  90 tablet    Refill:  3  . sildenafil (REVATIO) 20 MG tablet    Sig: Take 2-5 tablets (40mg -100mg ) once daily as needed prior to sexual activity.    Dispense:  20 tablet    Refill:  3    Patient Instructions  Medication Instructions:  1) STOP HCTZ 2) INCREASE AMLODIPINE to 10 mg daily 3) START SILDENAFIL 20 mg (2-5 tablets) daily as needed prior to sexual activity  Labwork: TODAY: CMET, lipids  Testing/Procedures: Dr. Excell Seltzerooper recommends you have a CORONARY CALCIUM SCORE.  Follow-Up: Your provider wants you to follow-up in: 1 year with Dr. Excell Seltzerooper or his assistant. You will receive a reminder letter in the mail two months in advance. If you don't receive a letter, please call our office to schedule the follow-up appointment.    Any  Other Special Instructions Will Be Listed Below (If Applicable).     If you need a refill on your cardiac medications before your next appointment, please call your pharmacy.      Signed, Tonny BollmanMichael Pilar Westergaard, MD  07/23/2018 12:42 PM    [facility]

## 2018-07-23 NOTE — Patient Instructions (Signed)
Medication Instructions:  1) STOP HCTZ 2) INCREASE AMLODIPINE to 10 mg daily 3) START SILDENAFIL 20 mg (2-5 tablets) daily as needed prior to sexual activity  Labwork: TODAY: CMET, lipids  Testing/Procedures: Dr. Excell Seltzerooper recommends you have a CORONARY CALCIUM SCORE.  Follow-Up: Your provider wants you to follow-up in: 1 year with Dr. Excell Seltzerooper or his assistant. You will receive a reminder letter in the mail two months in advance. If you don't receive a letter, please call our office to schedule the follow-up appointment.    Any Other Special Instructions Will Be Listed Below (If Applicable).     If you need a refill on your cardiac medications before your next appointment, please call your pharmacy.

## 2018-07-26 ENCOUNTER — Other Ambulatory Visit: Payer: Self-pay

## 2018-07-26 ENCOUNTER — Telehealth: Payer: Self-pay

## 2018-07-26 MED ORDER — SILDENAFIL CITRATE 50 MG PO TABS: 50.0000 mg | ORAL_TABLET | Freq: Every day | ORAL | 6 refills | Status: DC | PRN

## 2018-07-26 NOTE — Telephone Encounter (Signed)
We received a fax from [facility] stating that a PA is needed on the pts Sildenafil 20 mg.  I s/w our pharmacist, Nicholaus BloomKelley, who advised me that the 20 mg tablet is for Pulmonary HTN only and that 25 mg, 50 mg or 100 mg is used for ED which is what Sildenafil was ordered for in this case.  Per Dr Excell Seltzerooper I have sent in a new Sildenafil 50 mg (To be taken PRN) RX to [facility].  I called [facility] and was advised that this RX is covered by the pts insurance for #6 tablets per month.  I have contacted the pt and made him aware. He verbalized understanding and thanked me for calling to let him know.

## 2018-08-16 DIAGNOSIS — I10 Essential (primary) hypertension: Secondary | ICD-10-CM | POA: Diagnosis not present

## 2018-08-17 ENCOUNTER — Ambulatory Visit (INDEPENDENT_AMBULATORY_CARE_PROVIDER_SITE_OTHER)
Admission: RE | Admit: 2018-08-17 | Discharge: 2018-08-17 | Disposition: A | Discharge status: Home / Self Care | Payer: Self-pay | Source: Ambulatory Visit | Attending: Cardiovascular Disease | Admitting: Cardiovascular Disease

## 2018-08-17 DIAGNOSIS — I10 Essential (primary) hypertension: Secondary | ICD-10-CM

## 2018-08-26 ENCOUNTER — Other Ambulatory Visit: Payer: Self-pay | Admitting: Cardiovascular Disease

## 2018-08-26 DIAGNOSIS — E785 Hyperlipidemia, unspecified: Secondary | ICD-10-CM

## 2018-09-13 DIAGNOSIS — J324 Chronic pansinusitis: Secondary | ICD-10-CM | POA: Diagnosis not present

## 2018-09-13 DIAGNOSIS — J3089 Other allergic rhinitis: Secondary | ICD-10-CM | POA: Diagnosis not present

## 2018-10-20 ENCOUNTER — Other Ambulatory Visit: Payer: Self-pay | Admitting: Cardiovascular Disease

## 2019-08-25 ENCOUNTER — Other Ambulatory Visit: Payer: Self-pay | Admitting: Cardiovascular Disease

## 2019-08-25 DIAGNOSIS — I10 Essential (primary) hypertension: Secondary | ICD-10-CM

## 2019-10-05 ENCOUNTER — Encounter: Payer: Self-pay | Admitting: Cardiovascular Disease

## 2019-10-05 ENCOUNTER — Other Ambulatory Visit: Payer: Self-pay

## 2019-10-05 ENCOUNTER — Ambulatory Visit: Payer: BC Managed Care – PPO | Admitting: Cardiovascular Disease

## 2019-10-05 VITALS — BP 128/76 | HR 60 | Ht 71.75 in | Wt 211.0 lb

## 2019-10-05 DIAGNOSIS — E782 Mixed hyperlipidemia: Secondary | ICD-10-CM | POA: Diagnosis not present

## 2019-10-05 DIAGNOSIS — I10 Essential (primary) hypertension: Secondary | ICD-10-CM | POA: Diagnosis not present

## 2019-10-05 NOTE — Patient Instructions (Signed)

## 2019-10-05 NOTE — Progress Notes (Signed)
Cardiology Office Note:    Date:  10/05/2019   ID:  ELSTON ALDAPE, DOB [birth year], MRN [id number]  PCP:  Darrow Bussing, MD  Cardiologist:  Tonny Bollman, MD  Electrophysiologist:  None   Referring MD: Darrow Bussing, MD   Chief Complaint  Patient presents with  . Hypertension    History of Present Illness:    Max Spencer is a 49 y.o. male with a hx of hypertension and heart palpitations, presenting for follow-up evaluation.  The patient underwent echo and exercise treadmill studies in 2014, both of which were essentially normal.  The patient is here alone today.  He is without complaint.  His exercise capacity has improved with regular exercise.  He can now exercise more than 30 minutes without difficulty.  He denies chest pain, chest pressure, shortness of breath, or heart palpitations.  He has tolerated amlodipine well and reports good control of his blood pressure.  Overall doing well with no complaints today.    Past Medical History:  Diagnosis Date  . Chronic sinus infection   . 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 High blood pressure   . Hyperlipidemia   . Hypertension     Past Surgical History:  Procedure Laterality Date  . PILONIDAL CYST EXCISION    . PILONIDAL CYST EXCISION  2007  . SINUS ENDO W/FUSION N/A 05/27/2013   Procedure: ENDOSCOPIC SINUS SURGERY WITH FUSION NAVIGATION;  Surgeon: Suzanna Obey, MD;  Location:  S[facility]ERY [facility];  Service: ENT;  Laterality: N/A;  . WISDOM TOOTH EXTRACTION      Current Medications: Current Meds  Medication Sig  . amLODipine (NORVASC) 10 MG tablet TAKE 1 TABLET BY MOUTH EVERY DAY  . omega-3 acid ethyl esters (LOVAZA) 1 g capsule TAKE 1 CAPSULE (1 G TOTAL) BY MOUTH 2 (TWO) TIMES DAILY.  . rosuvastatin (CRESTOR) 20 MG tablet Take 1 tablet (20 mg total) by mouth daily.  . sildenafil (VIAGRA) 50 MG tablet Take 1 tablet (50 mg total) by mouth daily as needed for erectile dysfunction.     Allergies:    Tetracyclines & related   Social History   Socioeconomic History  . Marital status: Married    Spouse name: Not on file  . Number of children: Not on file  . Years of education: Not on file  . Highest education level: Not on file  Occupational History  . Not on file  Tobacco Use  . Smoking status: Never Smoker  . Smokeless tobacco: Never Used  Substance and Sexual Activity  . Alcohol use: No  . Drug use: No  . Sexual activity: Not Currently  Other Topics Concern  . Not on file  Social History Narrative   ** Merged History Encounter **       He is married. He does not smoke cigarettes or drink alcohol. His children are ages 61, 50, and 43. 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   Social Determinants of Health   Financial Resource Strain:   . Difficulty of Paying Living Expenses: Not on file  Food Insecurity:   . Worried About Programme researcher, broadcasting/film/video in the Last Year: Not on file  . Ran Out of Food in the Last Year: Not on file  Transportation Needs:   . Lack of Transportation (Medical): Not on file  . Lack of Transportation (Non-Medical): Not on file  Physical Activity:   . Days of Exercise per Week:  Not on file  . Minutes of Exercise per Session: Not on file  Stress:   . Feeling of Stress : Not on file  Social Connections:   . Frequency of Communication with Friends and Family: Not on file  . Frequency of Social Gatherings with Friends and Family: Not on file  . Attends Religious Services: Not on file  . Active Member of Clubs or Organizations: Not on file  . Attends Archivist Meetings: Not on file  . Marital Status: Not on file     Family History: The patient's family history includes Cancer in his maternal grandmother; Diabetes in his maternal grandfather; Hyperlipidemia in his father, maternal grandfather, and mother; Hypertension in his maternal grandmother; Sudden death in his father.  ROS:   Please see the  history of present illness.    All other systems reviewed and are negative.  EKGs/Labs/Other Studies Reviewed:    The following studies were reviewed today: Echo 01-04-2013: Study Conclusions   Left ventricle: The cavity size was normal. Wall thickness  was increased in a pattern of mild LVH. The estimated  ejection fraction was 65%. Wall motion was normal; there  were no regional wall motion abnormalities. Left ventricular  diastolic function parameters were normal.   EKG:  EKG is ordered today.  The ekg ordered today demonstrates NSR 60 bpm, within normal limits  Recent Labs: No results found for requested labs within last 8760 hours.  Recent Lipid Panel    Component Value Date/Time   CHOL 144 07/23/2018 0902   TRIG 208 (H) 07/23/2018 0902   HDL 39 (L) 07/23/2018 0902   CHOLHDL 3.7 07/23/2018 0902   CHOLHDL 7 02/24/2017 1042   VLDL 28.2 08/27/2016 0945   LDLCALC 63 07/23/2018 0902   LDLDIRECT 39.0 02/24/2017 1042    Physical Exam:    VS:  BP 128/76   Pulse 60   Ht 5' 11.75" (1.822 m)   Wt 211 lb (95.7 kg)   SpO2 99%   BMI 28.82 kg/m     Wt Readings from Last 3 Encounters:  10/05/19 211 lb (95.7 kg)  07/23/18 212 lb 9.6 oz (96.4 kg)  05/28/17 216 lb (98 kg)     GEN:  Well nourished, well developed in no acute distress HEENT: Normal NECK: No JVD; No carotid bruits LYMPHATICS: No lymphadenopathy CARDIAC: RRR, no murmurs, rubs, gallops RESPIRATORY:  Clear to auscultation without rales, wheezing or rhonchi  ABDOMEN: Soft, non-tender, non-distended MUSCULOSKELETAL:  No edema; No deformity  SKIN: Warm and dry NEUROLOGIC:  Alert and oriented x 3 PSYCHIATRIC:  Normal affect   ASSESSMENT:    1. Essential hypertension   2. Mixed hyperlipidemia    PLAN:    In order of problems listed above:  1. BP well-controlled. Doing very well with his exercise program. Most recent labs reviewed and renal function/electrolytes within normal limits 2. Most recent lipid  panel reviewed.  Coronary calcium score is 0.  He continues on a statin drug.  LFTs are normal.  No changes are made.   Medication Adjustments/Labs and Tests Ordered: Current medicines are reviewed at length with the patient today.  Concerns regarding medicines are outlined above.  Orders Placed This Encounter  Procedures  . EKG 12-Lead   No orders of the defined types were placed in this encounter.   Patient Instructions  Medication Instructions:  Your provider recommends that you continue on your current medications as directed. Please refer to the Current Medication list given to you  today.   *If you need a refill on your cardiac medications before your next appointment, please call your pharmacy*  Follow-Up: At [facility], you and your health needs are our priority.  As part of our continuing mission to provide you with exceptional heart care, we have created designated Provider Care Teams.  These Care Teams include your primary Cardiologist (physician) and Advanced Practice Providers (APPs -  Physician Assistants and Nurse Practitioners) who all work together to provide you with the care you need, when you need it. Your next appointment:   12 month(s) The format for your next appointment:   In Person Provider:   You may see Tonny Bollman, MD or one of the following Advanced Practice Providers on your designated Care Team:    Tereso Newcomer, PA-C  Vin [place], PA-C  [place], [place]    Signed, Tonny Bollman, MD  10/05/2019 11:08 AM    [facility]

## 2019-10-21 ENCOUNTER — Other Ambulatory Visit: Payer: Self-pay | Admitting: Cardiovascular Disease

## 2019-10-21 DIAGNOSIS — I10 Essential (primary) hypertension: Secondary | ICD-10-CM

## 2019-10-21 MED ORDER — OMEGA-3-ACID ETHYL ESTERS 1 G PO CAPS: 1.0000 | ORAL_CAPSULE | Freq: Two times a day (BID) | ORAL | 3 refills | Status: DC

## 2019-10-21 MED ORDER — AMLODIPINE BESYLATE 10 MG PO TABS: 10.0000 mg | ORAL_TABLET | Freq: Every day | ORAL | 3 refills | Status: DC

## 2019-11-22 ENCOUNTER — Other Ambulatory Visit: Payer: Self-pay | Admitting: Cardiovascular Disease

## 2019-11-22 DIAGNOSIS — I10 Essential (primary) hypertension: Secondary | ICD-10-CM

## 2020-01-20 IMAGING — CT CT HEART SCORING
2 series · 16 of 20 positions shown, 18 images · non-contrast
Comparison: None.

Addendum:
EXAM:
OVER-READ INTERPRETATION  CT CHEST

The following report is an over-read performed by radiologist Dr.
Ducarly Jaksonn [REDACTED] on 08/17/2018. This
over-read does not include interpretation of cardiac or coronary
anatomy or pathology. The coronary calcium score interpretation by
the cardiologist is attached.
CLINICAL DATA: Risk stratification
Coronary Calcium Score
TECHNIQUE: The patient was scanned on a Siemens Somatom 64 slice scanner. Axial
non-contrast 3 mm slices were carried out through the heart. The
data set was analyzed on a dedicated work station and scored using
the Agatson method.

[Series 2: casc 3.0 i36f 2 bestdiast 72 % · axial · 0.39mm/px · z∈[-292,-172]mm · 8 of 52 slices shown, 10 images]
[im 6/52  vessel]
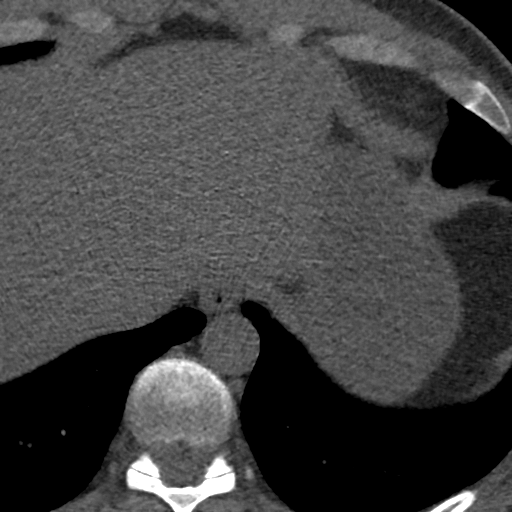
[im 6/52  lung]
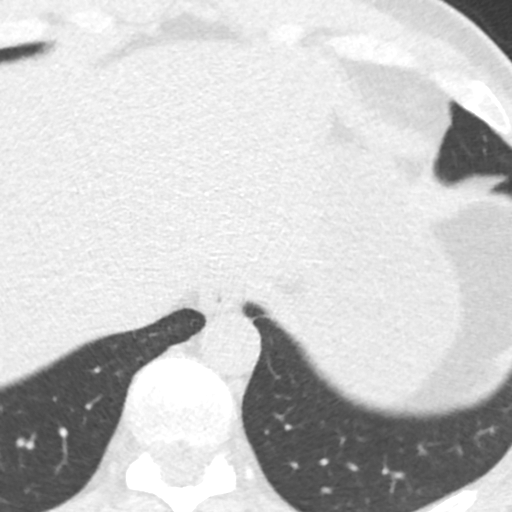
[im 12/52  vessel]
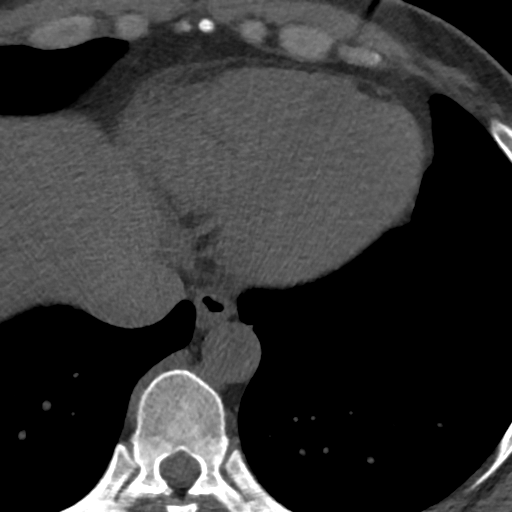
[im 18/52  vessel]
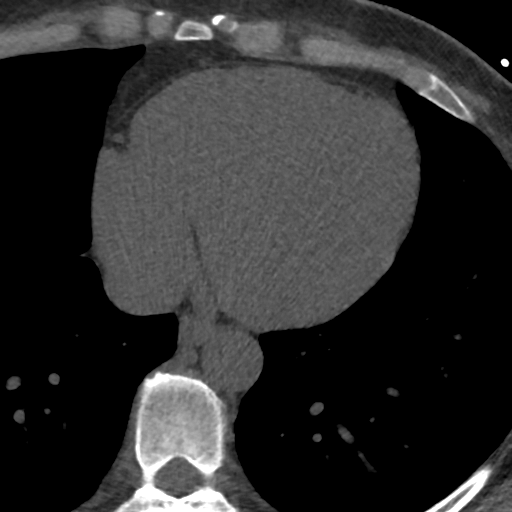
[im 23/52  vessel]
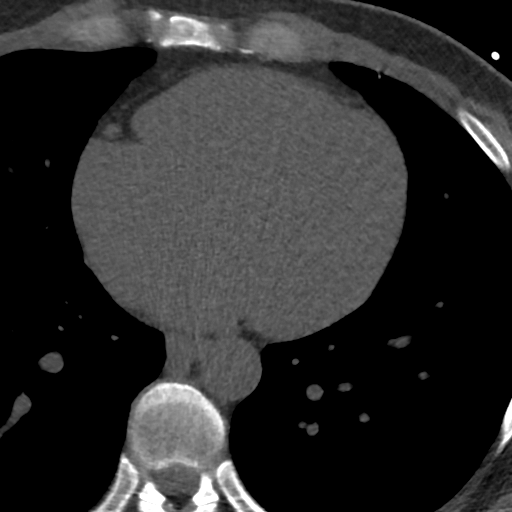
[im 29/52  vessel]
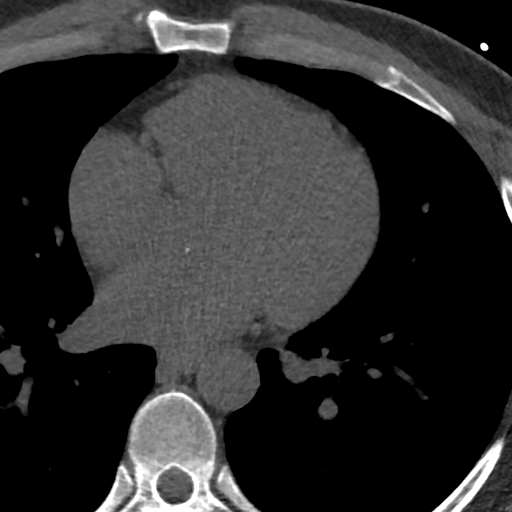
[im 29/52  lung]
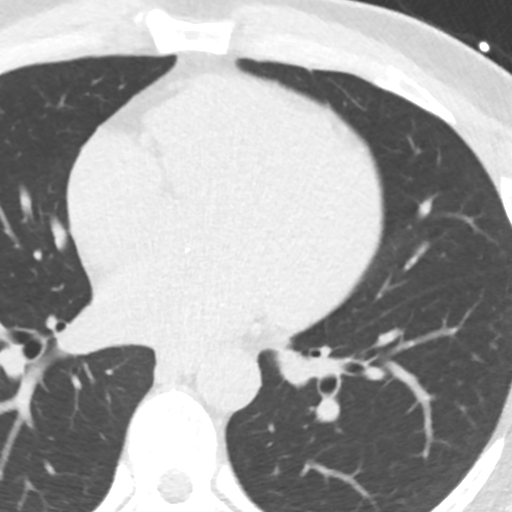
[im 35/52  vessel]
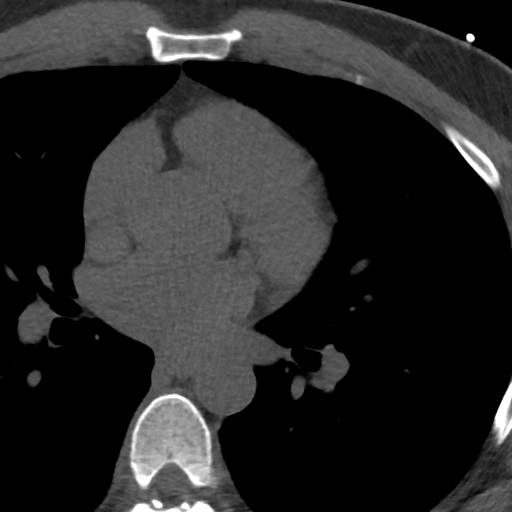
[im 40/52  vessel]
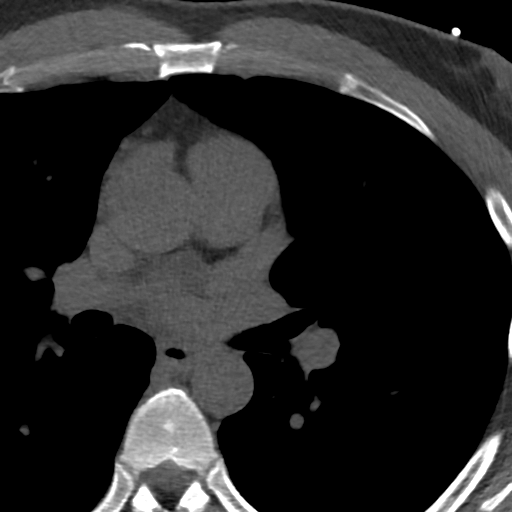
[im 46/52  vessel]
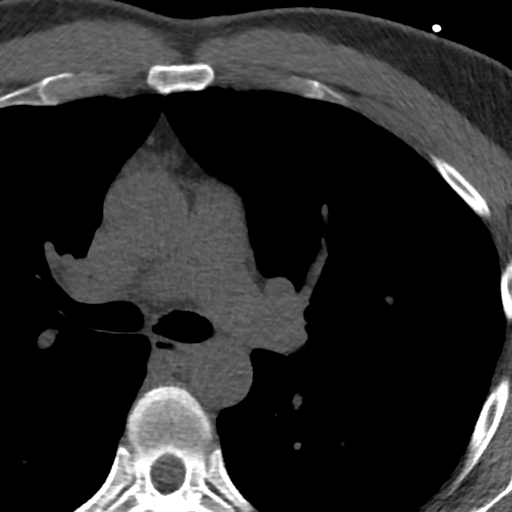

[Series 4: lung st 72 % · axial · 0.68mm/px · z∈[-295,-172]mm · 8 of 53 slices shown]
[im 6/53  lung]
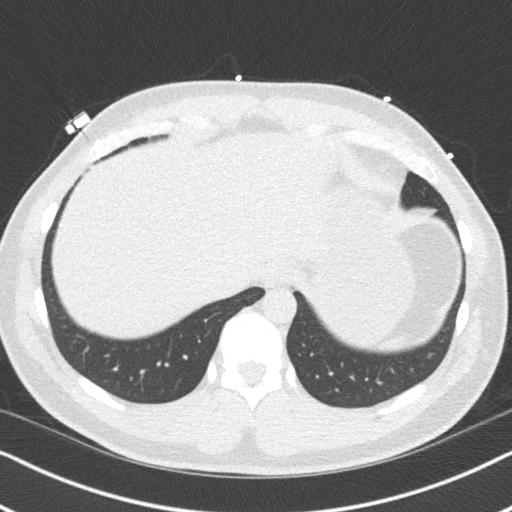
[im 12/53  lung]
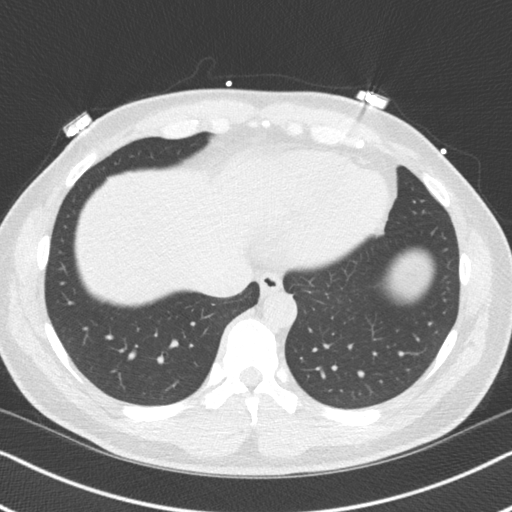
[im 18/53  lung]
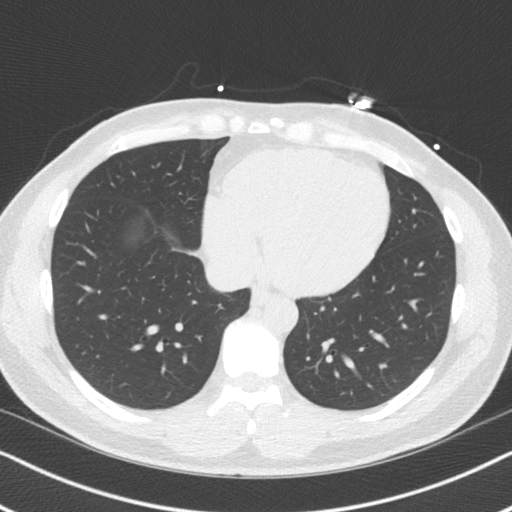
[im 24/53  lung]
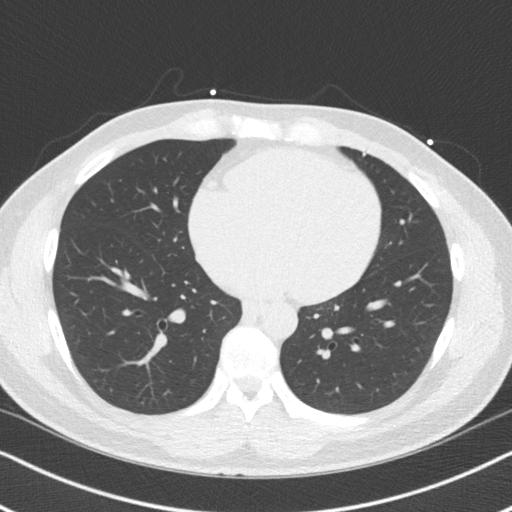
[im 29/53  lung]
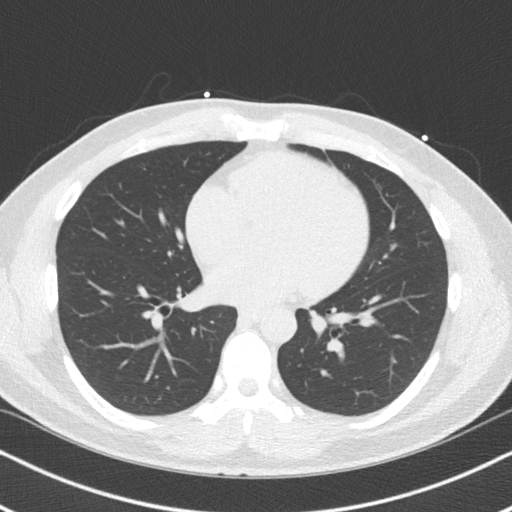
[im 35/53  lung]
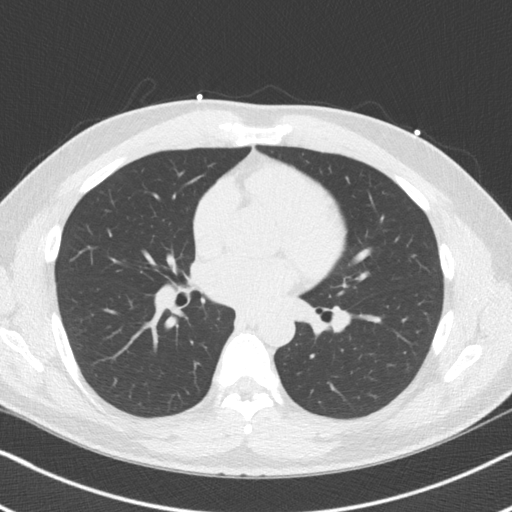
[im 41/53  lung]
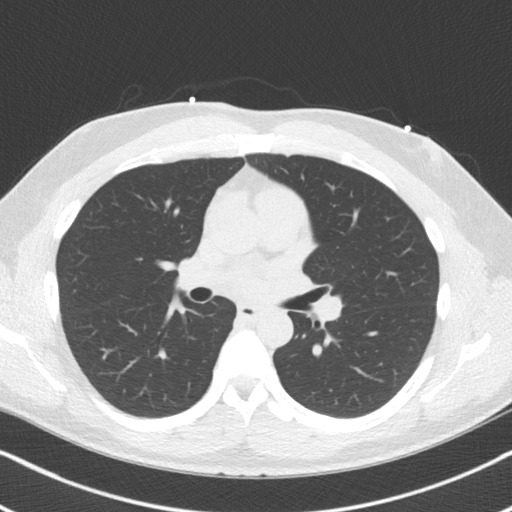
[im 47/53  lung]
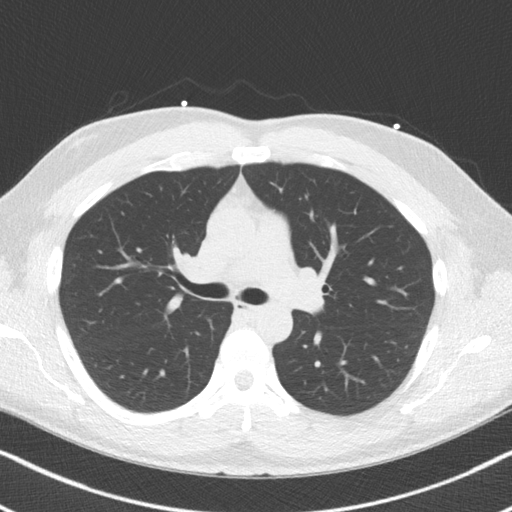

[16 of 20 positions shown; findings below may reference images not displayed]

FINDINGS: Vascular: Heart is normal size.  Visualized aorta is normal caliber.

Mediastinum/Nodes: No adenopathy in the lower mediastinum or hila.

Lungs/Pleura: Visualized lungs clear.  No effusions.

Upper Abdomen: Imaging into the upper abdomen shows no acute
findings.

Musculoskeletal: Chest wall soft tissues are unremarkable. No acute
bony abnormality.
IMPRESSION: No acute or significant extracardiac abnormality.
FINDINGS: Non-cardiac: See separate report from [REDACTED].

Ascending aorta: Normal diameter 3.3 cm

Pericardium: Normal

Coronary arteries: No calcium noted
IMPRESSION: Coronary calcium score of 0.

Danavon Bunnaman

*** End of Addendum ***

## 2020-06-27 ENCOUNTER — Other Ambulatory Visit: Payer: Self-pay | Admitting: Cardiovascular Disease

## 2020-06-28 ENCOUNTER — Telehealth: Payer: Self-pay | Admitting: Pharmacist

## 2020-06-28 MED ORDER — ICOSAPENT ETHYL 1 G PO CAPS: 2.0000 g | ORAL_CAPSULE | Freq: Two times a day (BID) | ORAL | 3 refills | Status: DC

## 2020-06-28 MED ORDER — ICOSAPENT ETHYL 1 G PO CAPS: 2.0000 g | ORAL_CAPSULE | Freq: Two times a day (BID) | ORAL | 11 refills | Status: DC

## 2020-06-28 NOTE — Telephone Encounter (Signed)
Pt called clinic stating that pharmacy cannot fill his Lovaza. Rx was sent in yesterday but pharmacy advised him that prior authorization was needed.  [employer] info as follows: ID: [id number] BIN: [id number] GRP: NOVARTIS  Started to submit prior auth but was notified that Vascepa is preferred. This has better CV outcomes data than Lovaza does - will switch pt from Lovaza to Vascepa. New rx sent to pharmacy and sent in $9 copay card - his insurance will only cover 1 month fill at a time. Pt is aware of change in fish oil formulation.

## 2020-09-28 ENCOUNTER — Encounter: Payer: Self-pay | Admitting: Cardiovascular Disease

## 2020-09-28 ENCOUNTER — Other Ambulatory Visit: Payer: Self-pay

## 2020-09-28 ENCOUNTER — Ambulatory Visit: Payer: BC Managed Care – PPO | Admitting: Cardiovascular Disease

## 2020-09-28 VITALS — BP 126/84 | HR 81 | Ht 71.75 in | Wt 207.8 lb

## 2020-09-28 DIAGNOSIS — I10 Essential (primary) hypertension: Secondary | ICD-10-CM | POA: Diagnosis not present

## 2020-09-28 DIAGNOSIS — R9431 Abnormal electrocardiogram [ECG] [EKG]: Secondary | ICD-10-CM

## 2020-09-28 DIAGNOSIS — E782 Mixed hyperlipidemia: Secondary | ICD-10-CM

## 2020-09-28 NOTE — Progress Notes (Signed)
Cardiology Office Note:    Date:  09/28/2020   ID:  Max Spencer, DOB 1971/06/03, MRN [id number]  PCP:  Darrow Bussing, MD  [facility] Cardiologist:  Tonny Bollman, MD  [facility] Electrophysiologist:  None   Referring MD: Darrow Bussing, MD   Chief Complaint  Patient presents with  . Hypertension    History of Present Illness:    Max Spencer is a 50 y.o. male with a hx of hypertension and heart palpitations, presenting for follow-up evaluation.  The patient is here alone today.  He was last seen 1 year ago for routine follow-up.  He has not had any cardiovascular testing now in about 7 years.  He feels well and denies symptoms of chest pain, chest pressure, or shortness of breath.  He has been experiencing more frequent heart palpitations.  These primarily occur at rest.  No edema, orthopnea, or PND.  He and his wife are in the process of adopting a 81-month-old son.  His other children are ages 7 and 61.  Past Medical History:  Diagnosis Date  . Chronic sinus infection   . 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 High blood pressure   . Hyperlipidemia   . Hypertension     Past Surgical History:  Procedure Laterality Date  . PILONIDAL CYST EXCISION    . PILONIDAL CYST EXCISION  2007  . SINUS ENDO W/FUSION N/A 05/27/2013   Procedure: ENDOSCOPIC SINUS SURGERY WITH FUSION NAVIGATION;  Surgeon: Suzanna Obey, MD;  Location: [facility];  Service: ENT;  Laterality: N/A;  . WISDOM TOOTH EXTRACTION      Current Medications: Current Meds  Medication Sig  . amLODipine (NORVASC) 10 MG tablet TAKE 1 TABLET BY MOUTH EVERY DAY  . famotidine (PEPCID) 20 MG tablet Take 20 mg by mouth as needed for heartburn or indigestion.  Marland Kitchen icosapent Ethyl (VASCEPA) 1 g capsule Take 2 g by mouth daily.  . rosuvastatin (CRESTOR) 20 MG tablet Take 1 tablet (20 mg total) by mouth daily.  . sildenafil (VIAGRA) 50 MG tablet Take 1 tablet (50 mg total) by mouth daily as  needed for erectile dysfunction.     Allergies:   Tetracyclines & related   Social History   Socioeconomic History  . Marital status: Married    Spouse name: Not on file  . Number of children: Not on file  . Years of education: Not on file  . Highest education level: Not on file  Occupational History  . Not on file  Tobacco Use  . Smoking status: Never Smoker  . Smokeless tobacco: Never Used  Substance and Sexual Activity  . Alcohol use: No  . Drug use: No  . Sexual activity: Not Currently  Other Topics Concern  . Not on file  Social History Narrative   ** Merged History Encounter **       He is married. He does not smoke cigarettes or drink alcohol. His children are ages 66, 21, and 34. 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   Social Determinants of Health   Financial Resource Strain: Not on file  Food Insecurity: Not on file  Transportation Needs: Not on file  Physical Activity: Not on file  Stress: Not on file  Social Connections: Not on file     Family History: The patient's family history includes Cancer in his maternal grandmother; Diabetes in his maternal grandfather; Hyperlipidemia in his father,  maternal grandfather, and mother; Hypertension in his maternal grandmother; Sudden death in his father.  ROS:   Please see the history of present illness.    All other systems reviewed and are negative.  EKGs/Labs/Other Studies Reviewed:    EKG:  EKG is ordered today.  The ekg ordered today demonstrates normal sinus rhythm 68 bpm, ST/T wave abnormality consider inferior ischemia, borderline changes of LVH.  Inferior ST segment changes new from previous tracing.  Recent Labs: No results found for requested labs within last 8760 hours.  Recent Lipid Panel    Component Value Date/Time   CHOL 144 07/23/2018 0902   TRIG 208 (H) 07/23/2018 0902   HDL 39 (L) 07/23/2018 0902   CHOLHDL 3.7 07/23/2018 0902   CHOLHDL 7  02/24/2017 1042   VLDL 28.2 08/27/2016 0945   LDLCALC 63 07/23/2018 0902   LDLDIRECT 39.0 02/24/2017 1042     Risk Assessment/Calculations:       Physical Exam:    VS:  BP 126/84   Pulse 81   Ht 5' 11.75" (1.822 m)   Wt 207 lb 12.8 oz (94.3 kg)   SpO2 99%   BMI 28.38 kg/m     Wt Readings from Last 3 Encounters:  09/28/20 207 lb 12.8 oz (94.3 kg)  10/05/19 211 lb (95.7 kg)  07/23/18 212 lb 9.6 oz (96.4 kg)     GEN:  Well nourished, well developed in no acute distress HEENT: Normal NECK: No JVD; No carotid bruits LYMPHATICS: No lymphadenopathy CARDIAC: RRR, no murmurs, rubs, gallops RESPIRATORY:  Clear to auscultation without rales, wheezing or rhonchi  ABDOMEN: Soft, non-tender, non-distended MUSCULOSKELETAL:  No edema; No deformity  SKIN: Warm and dry NEUROLOGIC:  Alert and oriented x 3 PSYCHIATRIC:  Normal affect   ASSESSMENT:    1. Abnormal EKG   2. Abnormal electrocardiogram   3. Essential hypertension   4. Mixed hyperlipidemia    PLAN:    In order of problems listed above:  1. The patient has new EKG changes from previous tracings.  I think it is possible that this is related to LVH but also could represent ischemic changes.  He has a strong family history of congestive heart failure and MI.  I have recommended a 2D echocardiogram to evaluate LV function and LVH/diastolic filling pattern.  I have recommended an exercise Myoview scan to evaluate for ischemic heart disease. 2. Blood pressure appears to be well controlled on current therapy. 3. He is intolerant of Vascepa because of reflux/chest fullness.  His lipids need to be updated.  He was unable to get Lovaza through his insurance.  He is tolerating a statin drug without side effects.     Shared Decision Making/Informed Consent The risks [chest pain, shortness of breath, cardiac arrhythmias, dizziness, blood pressure fluctuations, myocardial infarction, stroke/transient ischemic attack, nausea,  vomiting, allergic reaction, radiation exposure, metallic taste sensation and life-threatening complications (estimated to be 1 in 10,000)], benefits (risk stratification, diagnosing coronary artery disease, treatment guidance) and alternatives of a nuclear stress test were discussed in detail with Mr. Mitzel and he agrees to proceed.       Medication Adjustments/Labs and Tests Ordered: Current medicines are reviewed at length with the patient today.  Concerns regarding medicines are outlined above.  Orders Placed This Encounter  Procedures  . MYOCARDIAL PERFUSION IMAGING  . EKG 12-Lead  . ECHOCARDIOGRAM COMPLETE   No orders of the defined types were placed in this encounter.   Patient Instructions  Medication Instructions:  Your provider recommends that you continue on your current medications as directed. Please refer to the Current Medication list given to you today.   *If you need a refill on your cardiac medications before your next appointment, please call your pharmacy*  Testing/Procedures: Your physician has requested that you have an echocardiogram. Echocardiography is a painless test that uses sound waves to create images of your heart. It provides your doctor with information about the size and shape of your heart and how well your heart's chambers and valves are working. This procedure takes approximately one hour. There are no restrictions for this procedure.  Dr. Excell Seltzer recommends you have a NUCLEAR STRESS TEST. You will need to have a covid test prior.  Pre-Procedural COVID-19 Testing Site [phone] [place], [place] [address] You will need to go home after your screening and quarantine until your procedure.   Follow-Up: At [facility], you and your health needs are our priority.  As part of our continuing mission to provide you with exceptional heart care, we have created designated Provider Care Teams.  These Care Teams include your primary Cardiologist  (physician) and Advanced Practice Providers (APPs -  Physician Assistants and Nurse Practitioners) who all work together to provide you with the care you need, when you need it. Your next appointment:   12 month(s) The format for your next appointment:   In Person Provider:   You may see Tonny Bollman, MD or one of the following Advanced Practice Providers on your designated Care Team:    Tereso Newcomer, PA-C  [place], [place]      Signed, Tonny Bollman, MD  09/28/2020 5:37 PM    [facility]

## 2020-09-28 NOTE — Patient Instructions (Signed)
Medication Instructions:  Your provider recommends that you continue on your current medications as directed. Please refer to the Current Medication list given to you today.   *If you need a refill on your cardiac medications before your next appointment, please call your pharmacy*  Testing/Procedures: Your physician has requested that you have an echocardiogram. Echocardiography is a painless test that uses sound waves to create images of your heart. It provides your doctor with information about the size and shape of your heart and how well your heart's chambers and valves are working. This procedure takes approximately one hour. There are no restrictions for this procedure.  Dr. Excell Seltzer recommends you have a NUCLEAR STRESS TEST. You will need to have a covid test prior.  Pre-Procedural COVID-19 Testing Site [phone] [place], [place] [address] You will need to go home after your screening and quarantine until your procedure.   Follow-Up: At [facility], you and your health needs are our priority.  As part of our continuing mission to provide you with exceptional heart care, we have created designated Provider Care Teams.  These Care Teams include your primary Cardiologist (physician) and Advanced Practice Providers (APPs -  Physician Assistants and Nurse Practitioners) who all work together to provide you with the care you need, when you need it. Your next appointment:   12 month(s) The format for your next appointment:   In Person Provider:   You may see Tonny Bollman, MD or one of the following Advanced Practice Providers on your designated Care Team:    Tereso Newcomer, PA-C  Vin [place], [place]

## 2020-10-03 ENCOUNTER — Telehealth (HOSPITAL_COMMUNITY): Payer: Self-pay | Admitting: *Deleted

## 2020-10-03 NOTE — Telephone Encounter (Signed)
Patient given detailed instructions per Myocardial Perfusion Study Information Sheet for the test on 10/08/20. Patient notified to arrive 15 minutes early and that it is imperative to arrive on time for appointment to keep from having the test rescheduled.  If you need to cancel or reschedule your appointment, please call the office within 24 hours of your appointment. . Patient verbalized understanding. Max Spencer    

## 2020-10-04 ENCOUNTER — Other Ambulatory Visit (HOSPITAL_COMMUNITY)
Admission: RE | Admit: 2020-10-04 | Discharge: 2020-10-04 | Disposition: A | Discharge status: Home / Self Care | Payer: BC Managed Care – PPO | Source: Ambulatory Visit | Attending: Cardiovascular Disease | Admitting: Cardiovascular Disease

## 2020-10-04 DIAGNOSIS — Z01812 Encounter for preprocedural laboratory examination: Secondary | ICD-10-CM | POA: Diagnosis present

## 2020-10-04 DIAGNOSIS — U071 COVID-19: Secondary | ICD-10-CM | POA: Diagnosis not present

## 2020-10-04 LAB — SARS CORONAVIRUS 2 (TAT 6-24 HRS): SARS Coronavirus 2: POSITIVE — AB

## 2020-10-05 ENCOUNTER — Telehealth: Payer: Self-pay | Admitting: Cardiovascular Disease

## 2020-10-05 NOTE — Progress Notes (Signed)
Patient with positive covid result. Contacted MD and informed of result.   

## 2020-10-05 NOTE — Telephone Encounter (Signed)
New message:     Nurse from the covid testing center calling to report this patient has covid.

## 2020-10-08 ENCOUNTER — Ambulatory Visit: Payer: BC Managed Care – PPO | Admitting: Student

## 2020-10-08 ENCOUNTER — Encounter (HOSPITAL_COMMUNITY): Payer: BC Managed Care – PPO

## 2020-10-18 ENCOUNTER — Telehealth (HOSPITAL_COMMUNITY): Payer: Self-pay

## 2020-10-18 NOTE — Telephone Encounter (Signed)
Detailed instructions left on the patient's answering machine. Asked to call back with any questions. S.Mamye Bolds EMTP 

## 2020-10-23 ENCOUNTER — Ambulatory Visit (HOSPITAL_BASED_OUTPATIENT_CLINIC_OR_DEPARTMENT_OTHER): Payer: BC Managed Care – PPO

## 2020-10-23 ENCOUNTER — Other Ambulatory Visit: Payer: Self-pay

## 2020-10-23 ENCOUNTER — Ambulatory Visit (HOSPITAL_COMMUNITY): Payer: BC Managed Care – PPO | Attending: Cardiovascular Disease

## 2020-10-23 DIAGNOSIS — R9431 Abnormal electrocardiogram [ECG] [EKG]: Secondary | ICD-10-CM

## 2020-10-23 LAB — MYOCARDIAL PERFUSION IMAGING
Estimated workload: 15.5 METS
Exercise duration (min): 13 min
Exercise duration (sec): 10 s
LV dias vol: 99 mL (ref 62–150)
LV sys vol: 42 mL
MPHR: 172 {beats}/min
Peak HR: 169 {beats}/min
Percent HR: 98 %
Rest HR: 57 {beats}/min
SDS: 0
SRS: 0
SSS: 0
TID: 0.8

## 2020-10-23 LAB — ECHOCARDIOGRAM COMPLETE
Area-P 1/2: 3.53 cm2
Height: 71 in
S' Lateral: 2.9 cm
Weight: 3312 oz

## 2020-10-23 MED ORDER — TECHNETIUM TC 99M TETROFOSMIN IV KIT
10.2000 | PACK | Freq: Once | INTRAVENOUS | Status: AC | PRN
  Administered 2020-10-23: 10.2 via INTRAVENOUS
  Filled 2020-10-23: qty 11

## 2020-10-23 MED ORDER — TECHNETIUM TC 99M TETROFOSMIN IV KIT
31.7000 | PACK | Freq: Once | INTRAVENOUS | Status: AC | PRN
  Administered 2020-10-23: 31.7 via INTRAVENOUS
  Filled 2020-10-23: qty 32

## 2020-11-13 ENCOUNTER — Other Ambulatory Visit: Payer: Self-pay | Admitting: Pharmacist

## 2020-11-13 DIAGNOSIS — I10 Essential (primary) hypertension: Secondary | ICD-10-CM

## 2020-11-13 DIAGNOSIS — E785 Hyperlipidemia, unspecified: Secondary | ICD-10-CM

## 2020-11-13 MED ORDER — SILDENAFIL CITRATE 50 MG PO TABS: 50.0000 mg | ORAL_TABLET | Freq: Every day | ORAL | 6 refills | Status: AC | PRN

## 2020-11-13 MED ORDER — AMLODIPINE BESYLATE 10 MG PO TABS: 10.0000 mg | ORAL_TABLET | Freq: Every day | ORAL | 3 refills | Status: DC

## 2020-11-13 MED ORDER — ROSUVASTATIN CALCIUM 20 MG PO TABS: 20.0000 mg | ORAL_TABLET | Freq: Every day | ORAL | 3 refills | Status: DC

## 2020-11-15 ENCOUNTER — Other Ambulatory Visit: Payer: Self-pay | Admitting: Pharmacist

## 2020-11-15 DIAGNOSIS — R9431 Abnormal electrocardiogram [ECG] [EKG]: Secondary | ICD-10-CM

## 2020-11-15 MED ORDER — ICOSAPENT ETHYL 1 G PO CAPS: 1.0000 g | ORAL_CAPSULE | Freq: Every day | ORAL | 3 refills | Status: DC

## 2020-11-16 ENCOUNTER — Ambulatory Visit: Payer: BC Managed Care – PPO | Admitting: Physician Assistant

## 2021-08-19 ENCOUNTER — Ambulatory Visit: Payer: BC Managed Care – PPO | Admitting: Physician Assistant

## 2021-09-30 ENCOUNTER — Ambulatory Visit: Payer: BC Managed Care – PPO | Admitting: Cardiovascular Disease

## 2021-09-30 ENCOUNTER — Other Ambulatory Visit: Payer: Self-pay

## 2021-09-30 ENCOUNTER — Encounter: Payer: Self-pay | Admitting: Cardiovascular Disease

## 2021-09-30 ENCOUNTER — Ambulatory Visit (INDEPENDENT_AMBULATORY_CARE_PROVIDER_SITE_OTHER): Payer: BC Managed Care – PPO

## 2021-09-30 VITALS — BP 116/84 | HR 58 | Ht 71.0 in | Wt 206.8 lb

## 2021-09-30 DIAGNOSIS — I10 Essential (primary) hypertension: Secondary | ICD-10-CM | POA: Diagnosis not present

## 2021-09-30 DIAGNOSIS — E782 Mixed hyperlipidemia: Secondary | ICD-10-CM | POA: Diagnosis not present

## 2021-09-30 DIAGNOSIS — R002 Palpitations: Secondary | ICD-10-CM

## 2021-09-30 LAB — BASIC METABOLIC PANEL
BUN/Creatinine Ratio: 10 (ref 9–20)
BUN: 11 mg/dL (ref 6–24)
CO2: 27 mmol/L (ref 20–29)
Calcium: 9.9 mg/dL (ref 8.7–10.2)
Chloride: 102 mmol/L (ref 96–106)
Creatinine, Ser: 1.1 mg/dL (ref 0.76–1.27)
Glucose: 98 mg/dL (ref 70–99)
Potassium: 4.7 mmol/L (ref 3.5–5.2)
Sodium: 141 mmol/L (ref 134–144)
eGFR: 82 mL/min/{1.73_m2} (ref >59)

## 2021-09-30 LAB — MAGNESIUM: Magnesium: 2.1 mg/dL (ref 1.6–2.3)

## 2021-09-30 NOTE — Progress Notes (Unsigned)
Enrolled for [employer] to mail a ZIO XT long term holter monitor to the patients address on file.  

## 2021-09-30 NOTE — Progress Notes (Signed)
Cardiology Office Note:    Date:  09/30/2021   ID:  Max Spencer, DOB 04/16/1971, MRN [id number]  PCP:  Darrow Bussing, MD   [facility] Providers Cardiologist:  Tonny Bollman, MD     Referring MD: Darrow Bussing, MD   Chief Complaint  Patient presents with   Palpitations    History of Present Illness:    Max Spencer is a 51 y.o. male with a hx of HTN and palpitations, presenting for follow-up evaluation. He has undergone coronary Ca CT, echo, and nuclear stress testing, all testing reassuring. However, his palpitations have recently become much more bothersome to him. He reports increased frequency of palpitations mostly occurring at rest. He has cut caffeine. He exercises regularly with only occasional palpitations. He denies chest pain, chest pressure, or dyspnea. He and his wife have adopted an 79 month old and he admits to being under more stress which he thinks might be contributing to his symptoms.   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   famotidine (PEPCID) 20 MG tablet Take 20 mg by mouth as needed for heartburn or indigestion.   hyoscyamine (ANASPAZ) 0.125 MG TBDP disintergrating tablet 1-2 tab(s) orally 4 times a day for 30 day(s)   icosapent Ethyl (VASCEPA) 1 g capsule Take 1 capsule (1 g total) by mouth daily.   rosuvastatin (CRESTOR) 20 MG tablet Take 1  tablet (20 mg total) by mouth daily.   sildenafil (VIAGRA) 50 MG tablet Take 1 tablet (50 mg total) by mouth daily as needed for erectile dysfunction.     Allergies:   Tetracyclines & related   Social History   Socioeconomic History   Marital status: Married    Spouse name: Not on file   Number of children: Not on file   Years of education: Not on file   Highest education level: Not on file  Occupational History   Not on file  Tobacco Use   Smoking status: Never   Smokeless tobacco: Never  Substance and Sexual Activity   Alcohol use: No   Drug use: No   Sexual activity: Not Currently  Other Topics Concern   Not on file  Social History Narrative   ** Merged History Encounter **       He is married. He does not smoke cigarettes or drink alcohol. His children are ages 10, 71, and 20. 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.   Social Determinants of Health   Financial Resource Strain: Not on file  Food Insecurity: Not on file  Transportation Needs: Not on file  Physical Activity: Not on file  Stress: Not on file  Social Connections: Not on file     Family History: The patient's family history includes Cancer in his maternal  grandmother; Diabetes in his maternal grandfather; Hyperlipidemia in his father, maternal grandfather, and mother; Hypertension in his maternal grandmother; Sudden death in his father.  ROS:   Please see the history of present illness.    All other systems reviewed and are negative.  EKGs/Labs/Other Studies Reviewed:    The following studies were reviewed today: Echo 10/23/2020: IMPRESSIONS     1. Left ventricular ejection fraction, by estimation, is 55 to 60%. The  left ventricle has normal function. The left ventricle has no regional  wall motion abnormalities. Left ventricular diastolic parameters were  normal.   2. Right ventricular systolic function is normal. The right ventricular  size is normal.    3. The mitral valve is normal in structure. Trivial mitral valve  regurgitation.   4. The aortic valve is normal in structure. Aortic valve regurgitation is  not visualized.   Myoview Stress Test: The left ventricular ejection fraction is normal (55-65%). Nuclear stress EF: 57%. Blood pressure demonstrated a hypertensive response to exercise. Upsloping ST segment depression ST segment depression was noted during stress in the II, III, aVF, V4, V5 and V6 leads, and returning to baseline after 1-5 minutes of recovery. No T wave inversion was noted during stress. The study is normal. This is a low risk study with no evidence of ischemia. Excellent exercise capacity.  CT Calcium Score 08/17/2018: FINDINGS: Non-cardiac: [facility].   Ascending aorta: Normal diameter 3.3 cm   Pericardium: Normal   Coronary arteries: No calcium noted   IMPRESSION: Coronary calcium score of 0.  EKG:  EKG is ordered today.  The ekg ordered today demonstrates Sinus bradycardia 58 bpm with premature supraventricular contractions  Recent Labs: No results found for requested labs within last 8760 hours.  Recent Lipid Panel    Component Value Date/Time   CHOL 144 07/23/2018 0902   TRIG 208 (H) 07/23/2018 0902   HDL 39 (L) 07/23/2018 0902   CHOLHDL 3.7 07/23/2018 0902   CHOLHDL 7 02/24/2017 1042   VLDL 28.2 08/27/2016 0945   LDLCALC 63 07/23/2018 0902   LDLDIRECT 39.0 02/24/2017 1042     Risk Assessment/Calculations:           Physical Exam:    VS:  BP 116/84    Pulse (!) 58    Ht 5\' 11"  (1.803 m)    Wt 206 lb 12.8 oz (93.8 kg)    SpO2 99%    BMI 28.84 kg/m     Wt Readings from Last 3 Encounters:  09/30/21 206 lb 12.8 oz (93.8 kg)  10/23/20 207 lb (93.9 kg)  09/28/20 207 lb 12.8 oz (94.3 kg)     GEN:  Well nourished, well developed in no acute distress HEENT: Normal NECK: No JVD; No carotid bruits LYMPHATICS: No lymphadenopathy CARDIAC: RRR, no  murmurs, rubs, gallops RESPIRATORY:  Clear to auscultation without rales, wheezing or rhonchi  ABDOMEN: Soft, non-tender, non-distended MUSCULOSKELETAL:  No edema; No deformity  SKIN: Warm and dry NEUROLOGIC:  Alert and oriented x 3 PSYCHIATRIC:  Normal affect   ASSESSMENT:    1. Palpitations   2. Essential hypertension   3. Mixed hyperlipidemia    PLAN:    In order of problems listed above:  PAC's noted on today's EKG tracing. Recommend 3 day ZIO monitor to better define burden of ectopics. Echo/Myoview results reviewed and no structural heart abnormality noted. Consider beta blocker but resting bradycardia may limit this approach. Will review monitor result before recommending specific  treatment. Check metabolic panel, Mg. Discussed avoidance of caffeine, adequate fluids, stress, and sleep hygiene.  BP controlled on amlodipine.  Treated with rosuvastatin 20 mg daily.           Medication Adjustments/Labs and Tests Ordered: Current medicines are reviewed at length with the patient today.  Concerns regarding medicines are outlined above.  Orders Placed This Encounter  Procedures   Basic metabolic panel   Magnesium   LONG TERM MONITOR (3-14 DAYS)   EKG 12-Lead   No orders of the defined types were placed in this encounter.   Patient Instructions  Medication Instructions:  Your physician recommends that you continue on your current medications as directed. Please refer to the Current Medication list given to you today.  *If you need a refill on your cardiac medications before your next appointment, please call your pharmacy*   Lab Work: BMET, MAG If you have labs (blood work) drawn today and your tests are completely normal, you will receive your results only by: MyChart Message (if you have MyChart) OR A paper copy in the mail If you have any lab test that is abnormal or we need to change your treatment, we will call you to review the  results.   Testing/Procedures: ZIO (3 day) monitor   Follow-Up: At [facility], you and your health needs are our priority.  As part of our continuing mission to provide you with exceptional heart care, we have created designated Provider Care Teams.  These Care Teams include your primary Cardiologist (physician) and Advanced Practice Providers (APPs -  Physician Assistants and Nurse Practitioners) who all work together to provide you with the care you need, when you need it.   Your next appointment:   1 year(s)  The format for your next appointment:   In Person  Provider:   Tonny Bollman, MD     Other Instructions ZIO XT- Long Term Monitor Instructions  Your physician has requested you wear a ZIO patch monitor for 3 days.  This is a single patch monitor. [employer] supplies one patch monitor per enrollment. Additional stickers are not available. Please do not apply patch if you will be having a Nuclear Stress Test,  Echocardiogram, Cardiac CT, MRI, or Chest Xray during the period you would be wearing the  monitor. The patch cannot be worn during these tests. You cannot remove and re-apply the  ZIO XT patch monitor.  Your ZIO patch monitor will be mailed 3 day USPS to your address on file. It may take 3-5 days  to receive your monitor after you have been enrolled.  Once you have received your monitor, please review the enclosed instructions. Your monitor  has already been registered assigning a specific monitor serial # to you.  Billing and Patient Assistance Program Information  We have supplied [employer] with any of your insurance information on file for billing purposes. [employer] offers a sliding scale Patient Assistance Program for patients that do not have  insurance, or whose insurance does not completely cover the cost of the ZIO monitor.  You must apply for the Patient Assistance Program to qualify for this discounted rate.  To apply, please call [employer] at  [phone], select option 4, select option 2, ask to apply for  Patient Assistance Program. Meredeth Ide will ask your household income, and how many people  are in your household. They will quote your out-of-pocket cost based on that information.  [employer] will also be able to set up a 29-month, interest-free payment  plan if needed.  Applying the monitor   Shave hair from upper left chest.  Hold abrader disc by orange tab. Rub abrader in 40 strokes over the upper left chest as  indicated in your monitor instructions.  Clean area with 4 enclosed alcohol pads. Let dry.  Apply patch as indicated in monitor instructions. Patch will be placed under collarbone on left  side of chest with arrow pointing upward.  Rub patch adhesive wings for 2 minutes. Remove white label marked "1". Remove the white  label marked "2". Rub patch adhesive wings for 2 additional minutes.  While looking in a mirror, press and release button in center of patch. A small green light will  flash 3-4 times. This will be your only indicator that the monitor has been turned on.  Do not shower for the first 24 hours. You may shower after the first 24 hours.  Press the button if you feel a symptom. You will hear a small click. Record Date, Time and  Symptom in the Patient Logbook.  When you are ready to remove the patch, follow instructions on the last 2 pages of Patient  Logbook. Stick patch monitor onto the last page of Patient Logbook.  Place Patient Logbook in the blue and white box. Use locking tab on box and tape box closed  securely. The blue and white box has prepaid postage on it. Please place it in the mailbox as  soon as possible. Your physician should have your test results approximately 7 days after the  monitor has been [facility] to [facility].  Call [facility] Customer Care at [phone] if you have questions regarding  your ZIO XT patch monitor. Call them immediately if you see an orange light  blinking on your  monitor.  If your monitor falls off in less than 4 days, contact our Monitor department at [phone].  If your monitor becomes loose or falls off after 4 days call [employer] at [phone] for  suggestions on securing your monitor     Signed, Tonny BollmanMichael Taray Normoyle, MD  09/30/2021 4:56 PM    [facility]

## 2021-09-30 NOTE — Patient Instructions (Signed)
Medication Instructions:  Your physician recommends that you continue on your current medications as directed. Please refer to the Current Medication list given to you today.  *If you need a refill on your cardiac medications before your next appointment, please call your pharmacy*   Lab Work: BMET, MAG If you have labs (blood work) drawn today and your tests are completely normal, you will receive your results only by: MyChart Message (if you have MyChart) OR A paper copy in the mail If you have any lab test that is abnormal or we need to change your treatment, we will call you to review the results.   Testing/Procedures: ZIO (3 day) monitor   Follow-Up: At [facility], you and your health needs are our priority.  As part of our continuing mission to provide you with exceptional heart care, we have created designated Provider Care Teams.  These Care Teams include your primary Cardiologist (physician) and Advanced Practice Providers (APPs -  Physician Assistants and Nurse Practitioners) who all work together to provide you with the care you need, when you need it.   Your next appointment:   1 year(s)  The format for your next appointment:   In Person  Provider:   Tonny Bollman, MD     Other Instructions ZIO XT- Long Term Monitor Instructions  Your physician has requested you wear a ZIO patch monitor for 3 days.  This is a single patch monitor. [employer] supplies one patch monitor per enrollment. Additional stickers are not available. Please do not apply patch if you will be having a Nuclear Stress Test,  Echocardiogram, Cardiac CT, MRI, or Chest Xray during the period you would be wearing the  monitor. The patch cannot be worn during these tests. You cannot remove and re-apply the  ZIO XT patch monitor.  Your ZIO patch monitor will be mailed 3 day USPS to your address on file. It may take 3-5 days  to receive your monitor after you have been enrolled.  Once you have  received your monitor, please review the enclosed instructions. Your monitor  has already been registered assigning a specific monitor serial # to you.  Billing and Patient Assistance Program Information  We have supplied [employer] with any of your insurance information on file for billing purposes. [employer] offers a sliding scale Patient Assistance Program for patients that do not have  insurance, or whose insurance does not completely cover the cost of the ZIO monitor.  You must apply for the Patient Assistance Program to qualify for this discounted rate.  To apply, please call [employer] at [phone], select option 4, select option 2, ask to apply for  Patient Assistance Program. Meredeth Ide will ask your household income, and how many people  are in your household. They will quote your out-of-pocket cost based on that information.  [employer] will also be able to set up a 5-month, interest-free payment plan if needed.  Applying the monitor   Shave hair from upper left chest.  Hold abrader disc by orange tab. Rub abrader in 40 strokes over the upper left chest as  indicated in your monitor instructions.  Clean area with 4 enclosed alcohol pads. Let dry.  Apply patch as indicated in monitor instructions. Patch will be placed under collarbone on left  side of chest with arrow pointing upward.  Rub patch adhesive wings for 2 minutes. Remove white label marked "1". Remove the white  label marked "2". Rub patch adhesive wings for 2 additional minutes.  While looking  in a mirror, press and release button in center of patch. A small green light will  flash 3-4 times. This will be your only indicator that the monitor has been turned on.  Do not shower for the first 24 hours. You may shower after the first 24 hours.  Press the button if you feel a symptom. You will hear a small click. Record Date, Time and  Symptom in the Patient Logbook.  When you are ready to remove the patch, follow instructions on  the last 2 pages of Patient  Logbook. Stick patch monitor onto the last page of Patient Logbook.  Place Patient Logbook in the blue and white box. Use locking tab on box and tape box closed  securely. The blue and white box has prepaid postage on it. Please place it in the mailbox as  soon as possible. Your physician should have your test results approximately 7 days after the  monitor has been mailed back to [facility].  Call [facility] Customer Care at [phone] if you have questions regarding  your ZIO XT patch monitor. Call them immediately if you see an orange light blinking on your  monitor.  If your monitor falls off in less than 4 days, contact our Monitor department at [phone].  If your monitor becomes loose or falls off after 4 days call [employer] at [phone] for  suggestions on securing your monitor

## 2021-10-02 DIAGNOSIS — R002 Palpitations: Secondary | ICD-10-CM

## 2021-10-22 ENCOUNTER — Encounter: Payer: Self-pay | Admitting: Cardiovascular Disease

## 2021-12-02 ENCOUNTER — Other Ambulatory Visit: Payer: Self-pay | Admitting: Cardiovascular Disease

## 2021-12-02 DIAGNOSIS — I10 Essential (primary) hypertension: Secondary | ICD-10-CM

## 2021-12-04 ENCOUNTER — Other Ambulatory Visit: Payer: Self-pay | Admitting: Cardiovascular Disease

## 2021-12-04 DIAGNOSIS — I10 Essential (primary) hypertension: Secondary | ICD-10-CM

## 2021-12-04 MED ORDER — AMLODIPINE BESYLATE 10 MG PO TABS: 10.0000 mg | ORAL_TABLET | Freq: Every day | ORAL | 2 refills | Status: DC

## 2021-12-04 NOTE — Addendum Note (Signed)
Addended by: Margaret Pyle D on: 12/04/2021 04:02 PM ? ? Modules accepted: Orders ? ?

## 2022-03-26 DIAGNOSIS — E7800 Pure hypercholesterolemia, unspecified: Secondary | ICD-10-CM | POA: Diagnosis not present

## 2022-03-26 DIAGNOSIS — Z0000 Encounter for general adult medical examination without abnormal findings: Secondary | ICD-10-CM | POA: Diagnosis not present

## 2022-03-26 DIAGNOSIS — I10 Essential (primary) hypertension: Secondary | ICD-10-CM | POA: Diagnosis not present

## 2022-03-26 DIAGNOSIS — Z125 Encounter for screening for malignant neoplasm of prostate: Secondary | ICD-10-CM | POA: Diagnosis not present

## 2022-05-02 ENCOUNTER — Encounter: Payer: Self-pay | Admitting: Cardiovascular Disease

## 2022-05-02 DIAGNOSIS — R9431 Abnormal electrocardiogram [ECG] [EKG]: Secondary | ICD-10-CM

## 2022-05-02 MED ORDER — ICOSAPENT ETHYL 1 G PO CAPS: 1.0000 g | ORAL_CAPSULE | Freq: Every day | ORAL | 3 refills | Status: DC

## 2022-09-04 ENCOUNTER — Other Ambulatory Visit: Payer: Self-pay | Admitting: Cardiovascular Disease

## 2022-09-04 DIAGNOSIS — I10 Essential (primary) hypertension: Secondary | ICD-10-CM

## 2022-10-16 ENCOUNTER — Ambulatory Visit: Payer: BC Managed Care – PPO | Attending: Physician Assistant | Admitting: Physician Assistant

## 2022-10-16 ENCOUNTER — Encounter: Payer: Self-pay | Admitting: Physician Assistant

## 2022-10-16 VITALS — BP 142/82 | HR 93 | Ht 71.0 in | Wt 208.4 lb

## 2022-10-16 DIAGNOSIS — E782 Mixed hyperlipidemia: Secondary | ICD-10-CM

## 2022-10-16 DIAGNOSIS — I10 Essential (primary) hypertension: Secondary | ICD-10-CM | POA: Diagnosis not present

## 2022-10-16 DIAGNOSIS — R002 Palpitations: Secondary | ICD-10-CM

## 2022-10-16 DIAGNOSIS — I491 Atrial premature depolarization: Secondary | ICD-10-CM | POA: Diagnosis not present

## 2022-10-16 NOTE — Patient Instructions (Signed)
Medication Instructions:  Your physician recommends that you continue on your current medications as directed. Please refer to the Current Medication list given to you today.  *If you need a refill on your cardiac medications before your next appointment, please call your pharmacy*   Lab Work: Fasting lipid panel and lft's tomorrow, you can come anytime between 7:15 AM and 4:30 PM If you have labs (blood work) drawn today and your tests are completely normal, you will receive your results only by: [place] (if you have MyChart) OR A paper copy in the mail If you have any lab test that is abnormal or we need to change your treatment, we will call you to review the results.   Follow-Up: At [facility], you and your health needs are our priority.  As part of our continuing mission to provide you with exceptional heart care, we have created designated Provider Care Teams.  These Care Teams include your primary Cardiologist (physician) and Advanced Practice Providers (APPs -  Physician Assistants and Nurse Practitioners) who all work together to provide you with the care you need, when you need it.   Your next appointment:   1 year(s)  Provider:   Sherren Mocha, MD  or Nicholes Rough, PA-C

## 2022-10-16 NOTE — Progress Notes (Signed)
Office Visit    Patient Name: Max Spencer Date of Encounter: 10/16/2022  PCP:  Lujean Amel, [place]  Cardiologist:  Sherren Mocha, MD  Advanced Practice Provider:  No care team member to display Electrophysiologist:  None   HPI    Max Spencer is a 52 y.o. male with a past medical history significant for hypertension and palpitations presents today for annual follow-up visit.  He underwent coronary which CTA, echo, and nuclear stress testing was all reassuring.  However his palpitations has recently become more bothersome to him.  Reported increasing frequency mostly occurring at rest.  He cut out caffeine.  Exercises regularly with only occasional palpitations.  Denies chest pain, pressure in his chest, and dyspnea.  He and his wife adopted an 41-month-old and he was under more stress than usual.  He thought this was contributing to his symptoms.  Today, he tells me he now has a three year old. He also has grown children ages 36 and 74 years old. He states that overall things have been going well.  He does still have occasional palpitations but they mostly occur at night.  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  He has always had elevated triglycerides.  His most recent lipid panel done by his primary last summer revealed HDL 40, LDL 120, triglycerides 220.  He is on Vascepa 1 g a day (he was having shortness of breath on the higher dose) spoke with [employer], Pharm.D. about his lipids.  Possibly a candidate for Leqvio if they remain elevated.  We discussed the possibility of adding a beta-blocker for his palpitations which she would like to hold off on for now.  Reports no shortness of breath nor dyspnea on exertion. Reports no chest pain, pressure, or tightness. No edema, orthopnea, PND.   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   High blood pressure    Hyperlipidemia    Hypertension    Past Surgical History:  Procedure Laterality Date   PILONIDAL CYST EXCISION     PILONIDAL CYST EXCISION  2007   SINUS ENDO W/FUSION N/A 05/27/2013   Procedure: ENDOSCOPIC SINUS SURGERY WITH FUSION NAVIGATION;  Surgeon: Melissa Montane, MD;  Location: [place];  Service: ENT;  Laterality: N/A;   WISDOM TOOTH EXTRACTION      Allergies  Allergies  Allergen Reactions   Tetracyclines & Related     vertigo    EKGs/Labs/Other Studies Reviewed:   The following studies were reviewed today:  Zio monitor 09/30/21  Patch Wear Time:  2 days and 22 hours (2023-01-25T16:48:22-0500 to 2023-01-28T15:36:28-0500)   Patient had a min HR of 37 bpm, max HR of 152 bpm, and avg HR of 69 bpm. Predominant underlying rhythm was Sinus Rhythm. Isolated SVEs were frequent (18.0%, 54530), SVE Couplets were occasional (3.0%, 4512), and SVE Triplets were occasional (1.8%, 1852).  Isolated VEs were occasional (1.0%, 3092), VE Couplets were rare (<1.0%, 6), and no VE Triplets were present. Ventricular Bigeminy was present.    The basic rhythm is normal sinus with an average HR of 69 bpm No atrial fibrillation or flutter No high-grade heart block or pathologic pauses There are occasional PVC's occurring at about PVC burden of 1% without any sustained ventricular arrhythmia or long runs  There  are frequent supraventricular beats occurring at a supraventricular beat burden of 18% without sustained arrhythmias or long runs of SVT Echo 10/23/2020: IMPRESSIONS     1. Left ventricular ejection fraction, by estimation, is 55 to 60%. The  left ventricle has normal function. The left ventricle has no regional  wall motion abnormalities. Left ventricular diastolic parameters were  normal.   2. Right ventricular systolic function is normal. The right ventricular  size is normal.   3. The mitral valve is normal in  structure. Trivial mitral valve  regurgitation.   4. The aortic valve is normal in structure. Aortic valve regurgitation is  not visualized.    Myoview Stress Test: The left ventricular ejection fraction is normal (55-65%). Nuclear stress EF: 57%. Blood pressure demonstrated a hypertensive response to exercise. Upsloping ST segment depression ST segment depression was noted during stress in the II, III, aVF, V4, V5 and V6 leads, and returning to baseline after 1-5 minutes of recovery. No T wave inversion was noted during stress. The study is normal. This is a low risk study with no evidence of ischemia. Excellent exercise capacity.   CT Calcium Score 08/17/2018: FINDINGS: Non-cardiac: See separate report from [facility].   Ascending aorta: Normal diameter 3.3 cm   Pericardium: Normal   Coronary arteries: No calcium noted   IMPRESSION: Coronary calcium score of 0.    EKG:  EKG is  ordered today.  The ekg ordered today demonstrates NSR rate 80 bpm  Recent Labs: No results found for requested labs within last 365 days.  Recent Lipid Panel    Component Value Date/Time   CHOL 144 07/23/2018 0902   TRIG 208 (H) 07/23/2018 0902   HDL 39 (L) 07/23/2018 0902   CHOLHDL 3.7 07/23/2018 0902   CHOLHDL 7 02/24/2017 1042   VLDL 28.2 08/27/2016 0945   LDLCALC 63 07/23/2018 0902   LDLDIRECT 39.0 02/24/2017 1042    Home Medications   Current Meds  Medication Sig   amLODipine (NORVASC) 10 MG tablet Take 1 tablet (10 mg total) by mouth daily. Please call [phone] to schedule an appointment for future refills. Thank you.   hyoscyamine (ANASPAZ) 0.125 MG TBDP disintergrating tablet 1-2 tab(s) orally 4 times a day for 30 day(s)   icosapent Ethyl (VASCEPA) 1 g capsule Take 1 capsule (1 g total) by mouth daily.   rosuvastatin (CRESTOR) 20 MG tablet Take 1 tablet (20 mg total) by mouth daily.   sildenafil (VIAGRA) 50 MG tablet Take 1 tablet (50 mg total) by mouth daily as  needed for erectile dysfunction.     Review of Systems      All other systems reviewed and are otherwise negative except as noted above.  Physical Exam    VS:  BP (!) 142/82   Pulse 93   Ht 5\' 11"  (1.803 m)   Wt 208 lb 6.4 oz (94.5 kg)   SpO2 97%   BMI 29.07 kg/m  , BMI Body mass index is 29.07 kg/m.  Wt Readings from Last 3 Encounters:  10/16/22 208 lb 6.4 oz (94.5 kg)  09/30/21 206 lb 12.8 oz (93.8 kg)  10/23/20 207 lb (93.9 kg)     GEN: Well nourished, well developed, in no acute distress. HEENT: normal. Neck: Supple, no JVD, carotid bruits, or masses. Cardiac: RRR, occasional skipped beat, no murmurs, rubs, or gallops. No clubbing, cyanosis, edema.  Radials/PT 2+ and equal bilaterally.  Respiratory:  Respirations regular and unlabored, clear to auscultation bilaterally. GI: Soft, nontender,  nondistended. MS: No deformity or atrophy. Skin: Warm and dry, no rash. Neuro:  Strength and sensation are intact. Psych: Normal affect.  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  Signed, Elgie Collard, PA-C 10/16/2022, 4:46 PM [facility]

## 2022-10-17 ENCOUNTER — Ambulatory Visit: Payer: BC Managed Care – PPO | Attending: Physician Assistant

## 2022-10-17 DIAGNOSIS — I10 Essential (primary) hypertension: Secondary | ICD-10-CM

## 2022-10-17 DIAGNOSIS — R002 Palpitations: Secondary | ICD-10-CM

## 2022-10-17 DIAGNOSIS — I491 Atrial premature depolarization: Secondary | ICD-10-CM | POA: Diagnosis not present

## 2022-10-17 DIAGNOSIS — E782 Mixed hyperlipidemia: Secondary | ICD-10-CM | POA: Diagnosis not present

## 2022-10-18 LAB — HEPATIC FUNCTION PANEL
ALT: 19 IU/L (ref 0–44)
AST: 19 IU/L (ref 0–40)
Albumin: 4.5 g/dL (ref 4.1–5.1)
Alkaline Phosphatase: 79 IU/L (ref 44–121)
Bilirubin Total: 0.4 mg/dL (ref 0.0–1.2)
Bilirubin, Direct: 0.11 mg/dL (ref 0.00–0.40)
Total Protein: 7.1 g/dL (ref 6.0–8.5)

## 2022-10-18 LAB — LIPID PANEL
Chol/HDL Ratio: 4 ratio (ref 0.0–5.0)
Cholesterol, Total: 170 mg/dL (ref 100–199)
HDL: 43 mg/dL (ref >39)
LDL Chol Calc (NIH): 101 mg/dL — ABNORMAL HIGH (ref 0–99)
Triglycerides: 147 mg/dL (ref 0–149)
VLDL Cholesterol Cal: 26 mg/dL (ref 5–40)

## 2022-11-26 ENCOUNTER — Other Ambulatory Visit: Payer: Self-pay | Admitting: Cardiovascular Disease

## 2022-11-26 DIAGNOSIS — I10 Essential (primary) hypertension: Secondary | ICD-10-CM

## 2023-02-11 DIAGNOSIS — L237 Allergic contact dermatitis due to plants, except food: Secondary | ICD-10-CM | POA: Diagnosis not present

## 2023-03-27 DIAGNOSIS — B9689 Other specified bacterial agents as the cause of diseases classified elsewhere: Secondary | ICD-10-CM | POA: Diagnosis not present

## 2023-03-27 DIAGNOSIS — L0202 Furuncle of face: Secondary | ICD-10-CM | POA: Diagnosis not present

## 2023-05-05 DIAGNOSIS — Z125 Encounter for screening for malignant neoplasm of prostate: Secondary | ICD-10-CM | POA: Diagnosis not present

## 2023-05-05 DIAGNOSIS — I10 Essential (primary) hypertension: Secondary | ICD-10-CM | POA: Diagnosis not present

## 2023-05-05 DIAGNOSIS — E7800 Pure hypercholesterolemia, unspecified: Secondary | ICD-10-CM | POA: Diagnosis not present

## 2023-05-05 DIAGNOSIS — Z0000 Encounter for general adult medical examination without abnormal findings: Secondary | ICD-10-CM | POA: Diagnosis not present

## 2023-06-01 ENCOUNTER — Other Ambulatory Visit (HOSPITAL_COMMUNITY): Payer: Self-pay

## 2023-06-01 ENCOUNTER — Telehealth: Payer: Self-pay

## 2023-06-01 NOTE — Telephone Encounter (Signed)
Pharmacy Patient Advocate Encounter  Received notification from [facility] that Prior Authorization for VASCEPA has been APPROVED from 06/01/23 to 05/31/24. Ran test claim, Copay is $2. This test claim was processed through [facility]- copay amounts may vary at other pharmacies due to pharmacy/plan contracts, or as the patient moves through the different stages of their insurance plan.

## 2023-06-01 NOTE — Telephone Encounter (Signed)
Pharmacy Patient Advocate Encounter   Received notification from CoverMyMeds that prior authorization for VASCEPA is required/requested.   Insurance verification completed.   The patient is insured through [facility] .   Per test claim: PA required; PA submitted to [facility] via CoverMyMeds Key/confirmation #/EOC [id number] Status is pending

## 2023-08-08 ENCOUNTER — Other Ambulatory Visit: Payer: Self-pay | Admitting: Cardiovascular Disease

## 2023-08-08 DIAGNOSIS — R9431 Abnormal electrocardiogram [ECG] [EKG]: Secondary | ICD-10-CM

## 2023-10-19 ENCOUNTER — Ambulatory Visit: Payer: BC Managed Care – PPO | Attending: Physician Assistant | Admitting: Physician Assistant

## 2023-10-19 ENCOUNTER — Encounter: Payer: Self-pay | Admitting: Physician Assistant

## 2023-10-19 VITALS — BP 130/84 | HR 67 | Ht 71.0 in | Wt 205.8 lb

## 2023-10-19 DIAGNOSIS — E782 Mixed hyperlipidemia: Secondary | ICD-10-CM | POA: Diagnosis not present

## 2023-10-19 DIAGNOSIS — R002 Palpitations: Secondary | ICD-10-CM | POA: Diagnosis not present

## 2023-10-19 DIAGNOSIS — E785 Hyperlipidemia, unspecified: Secondary | ICD-10-CM

## 2023-10-19 DIAGNOSIS — I491 Atrial premature depolarization: Secondary | ICD-10-CM

## 2023-10-19 DIAGNOSIS — I10 Essential (primary) hypertension: Secondary | ICD-10-CM

## 2023-10-19 MED ORDER — ROSUVASTATIN CALCIUM 10 MG PO TABS: 10.0000 mg | ORAL_TABLET | Freq: Every day | ORAL | 1 refills | Status: DC

## 2023-10-19 MED ORDER — INCLISIRAN SODIUM 284 MG/1.5ML ~~LOC~~ SOSY
284.0000 mg | PREFILLED_SYRINGE | Freq: Once | SUBCUTANEOUS | Status: AC
  Administered 2024-09-29: 284 mg via SUBCUTANEOUS

## 2023-10-19 NOTE — Progress Notes (Signed)
 Office Visit    Patient Name: Max Spencer Date of Encounter: 10/19/2023  PCP:  Lanae Pinal, MD    [facility]  Cardiologist:  Arnoldo Lapping, MD  Advanced Practice Provider:  No care team member to display Electrophysiologist:  None   HPI    KASSON Spencer is a 53 y.o. male with a past medical history significant for hypertension and palpitations presents today for annual follow-up visit.  He underwent coronary which CTA, echo, and nuclear stress testing was all reassuring.  However his palpitations has recently become more bothersome to him.  Reported increasing frequency mostly occurring at rest.  He cut out caffeine.  Exercises regularly with only occasional palpitations.  Denies chest pain, pressure in his chest, and dyspnea.  He and his wife adopted an 52-month-old and he was under more stress than usual.  He thought this was contributing to his symptoms.  He was seen by me February 2024 for, he tells me he now has a three year old. He also has grown children ages 92 and 46 years old. He states that overall things have been going well.  He does still have occasional palpitations but they mostly occur at night.  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  He has always had elevated triglycerides.  His most recent lipid panel done by his primary last summer revealed HDL 40, LDL 120, triglycerides 220.  He is on Vascepa  1 g a day (he was having shortness of breath on the higher dose) spoke with Megan Supple, Pharm.D. about his lipids.  Possibly a candidate for Leqvio  if they remain elevated.  We discussed the possibility of adding a beta-blocker for his palpitations which she would like to hold off on for now.  Reports no shortness of breath nor dyspnea on exertion. Reports no chest pain, pressure, or tightness. No edema, orthopnea, PND.   Today, he presents with a history of heart disease and on Crestor  for  cholesterol management, with muscle cramps and fatigue. These symptoms are particularly pronounced during physical activities such as fishing tournaments and yard work. The patient has had to limit his participation in these activities due to the discomfort. The patient suspects that these symptoms might be side effects of Crestor , which he has been taking for a long time. The patient also mentions a family history of heart failure and expresses concern about his own heart health. Despite these issues, the patient remains active, engaging in calisthenics, running, and hiking. The patient also reports a decrease in weight.  He also endorses "brain fog" and delayed recollection. We will trial Leqvio  and discontinuation of crestor  and monitor his symptoms.   Recent echo back in 2022 reviewed with normal LVEF.  Reports no shortness of breath nor dyspnea on exertion. Reports no chest pain, pressure, or tightness. No edema, orthopnea, PND. Reports no palpitations.   Discussed the use of AI scribe software for clinical note transcription with the patient, who gave verbal consent to proceed.  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   High blood pressure    Hyperlipidemia    Hypertension    Past Surgical History:  Procedure Laterality Date   PILONIDAL CYST EXCISION     PILONIDAL CYST EXCISION  2007   SINUS ENDO W/FUSION N/A 05/27/2013   Procedure: ENDOSCOPIC SINUS  SURGERY WITH FUSION NAVIGATION;  Surgeon: Vernadine Golas, MD;  Location: [facility];  Service: ENT;  Laterality: N/A;   WISDOM TOOTH EXTRACTION      Allergies  Allergies  Allergen Reactions   Tetracyclines & Related     vertigo    EKGs/Labs/Other Studies Reviewed:   The following studies were reviewed today:  Zio monitor 09/30/21  Patch Wear Time:  2 days and 22 hours (2023-01-25T16:48:22-0500 to  2023-01-28T15:36:28-0500)   Patient had a min HR of 37 bpm, max HR of 152 bpm, and avg HR of 69 bpm. Predominant underlying rhythm was Sinus Rhythm. Isolated SVEs were frequent (18.0%, 54530), SVE Couplets were occasional (3.0%, 4512), and SVE Triplets were occasional (1.8%, 1852).  Isolated VEs were occasional (1.0%, 3092), VE Couplets were rare (<1.0%, 6), and no VE Triplets were present. Ventricular Bigeminy was present.    The basic rhythm is normal sinus with an average HR of 69 bpm No atrial fibrillation or flutter No high-grade heart block or pathologic pauses There are occasional PVC's occurring at about PVC burden of 1% without any sustained ventricular arrhythmia or long runs  There are frequent supraventricular beats occurring at a supraventricular beat burden of 18% without sustained arrhythmias or long runs of SVT  Echo 10/23/2020: IMPRESSIONS     1. Left ventricular ejection fraction, by estimation, is 55 to 60%. The  left ventricle has normal function. The left ventricle has no regional  wall motion abnormalities. Left ventricular diastolic parameters were  normal.   2. Right ventricular systolic function is normal. The right ventricular  size is normal.   3. The mitral valve is normal in structure. Trivial mitral valve  regurgitation.   4. The aortic valve is normal in structure. Aortic valve regurgitation is  not visualized.    Myoview  Stress Test: The left ventricular ejection fraction is normal (55-65%). Nuclear stress EF: 57%. Blood pressure demonstrated a hypertensive response to exercise. Upsloping ST segment depression ST segment depression was noted during stress in the II, III, aVF, V4, V5 and V6 leads, and returning to baseline after 1-5 minutes of recovery. No T wave inversion was noted during stress. The study is normal. This is a low risk study with no evidence of ischemia. Excellent exercise capacity.   CT Calcium  Score  08/17/2018: FINDINGS: Non-cardiac: See separate report from [facility].   Ascending aorta: Normal diameter 3.3 cm   Pericardium: Normal   Coronary arteries: No calcium  noted   IMPRESSION: Coronary calcium  score of 0.    EKG:  EKG is  ordered today.  The ekg ordered today demonstrates NSR rate 80 bpm  Recent Labs: No results found for requested labs within last 365 days.  Recent Lipid Panel    Component Value Date/Time   CHOL 170 10/17/2022 0831   TRIG 147 10/17/2022 0831   HDL 43 10/17/2022 0831   CHOLHDL 4.0 10/17/2022 0831   CHOLHDL 7 02/24/2017 1042   VLDL 28.2 08/27/2016 0945   LDLCALC 101 (H) 10/17/2022 0831   LDLDIRECT 39.0 02/24/2017 1042    Home Medications   Current Meds  Medication Sig   amLODipine  (NORVASC ) 10 MG tablet Take 1 tablet (10 mg total) by mouth daily.   famotidine (PEPCID) 20 MG tablet Take 20 mg by mouth as needed for heartburn or indigestion.   hyoscyamine (ANASPAZ) 0.125 MG TBDP disintergrating tablet 1-2 tab(s) orally 4 times a day for 30 day(s)   icosapent  Ethyl (VASCEPA ) 1 g capsule TAKE 1 CAPSULE  BY MOUTH DAILY.   sildenafil  (VIAGRA ) 50 MG tablet Take 1 tablet (50 mg total) by mouth daily as needed for erectile dysfunction.   [DISCONTINUED] rosuvastatin  (CRESTOR ) 20 MG tablet Take 1 tablet (20 mg total) by mouth daily.   Current Facility-Administered Medications for the 10/19/23 encounter (Office Visit) with Von Grumbling, PA-C  Medication   inclisiran (LEQVIO ) injection 284 mg     Review of Systems      All other systems reviewed and are otherwise negative except as noted above.  Physical Exam    VS:  BP 130/84   Pulse 67   Ht 5\' 11"  (1.803 m)   Wt 205 lb 12.8 oz (93.4 kg)   SpO2 96%   BMI 28.70 kg/m  , BMI Body mass index is 28.7 kg/m.  Wt Readings from Last 3 Encounters:  10/19/23 205 lb 12.8 oz (93.4 kg)  10/16/22 208 lb 6.4 oz (94.5 kg)  09/30/21 206 lb 12.8 oz (93.8 kg)     GEN: Well nourished, well  developed, in no acute distress. HEENT: normal. Neck: Supple, no JVD, carotid bruits, or masses. Cardiac: RRR, occasional skipped beat, no murmurs, rubs, or gallops. No clubbing, cyanosis, edema.  Radials/PT 2+ and equal bilaterally.  Respiratory:  Respirations regular and unlabored, clear to auscultation bilaterally. GI: Soft, nontender, nondistended. MS: No deformity or atrophy. Skin: Warm and dry, no rash. Neuro:  Strength and sensation are intact. Psych: Normal affect.  Assessment & Plan     Hyperlipidemia Patient reports muscle fatigue and cramps, possibly related to Crestor  use. Triglycerides remain elevated despite Crestor  and lifestyle modifications. Patient has access to [employer]  through employment with [employer]. -Transition patient from Crestor  to Leqvio . -Reduce Crestor  dose after first Leqvio  injection, then discontinue Crestor  one month after first Leqvio  injection. -Recheck lipid panel and liver function tests after discontinuation of Crestor  (in about 3 months)  Premature Ventricular Contractions (PVCs) Patient reports significant reduction in PVCs, no palpitations or skipped beats. EKG shows no PVCs and no ST depressions. -Continue current management.  Kidney Function Patient reports slightly low GFR on recent test, but no risk factors for kidney disease and no use of nephrotoxic medications. -Recommend rechecking GFR in a few months to assess for normalization (could have been due to some dehydration)  General Health Maintenance Patient reports active lifestyle including regular exercise and attempts at dietary modifications. -Encourage continuation of active lifestyle and dietary modifications for overall health.   Essential hypertension -better today 130/84 -continue current medications -continue to track at home   Disposition: Follow up 1 year with Arnoldo Lapping, MD or APP.  Signed, Von Grumbling, PA-C 10/19/2023, 2:16 PM [facility]

## 2023-10-19 NOTE — Patient Instructions (Addendum)
 Medication Instructions:   AFTER FIRST INJECTION LEQVIO : START TAKING CRESTOR  10 MG ONCE A DAY   THEN  AFTER ONE MONTH STOP TAKING CRESTOR .  *If you need a refill on your cardiac medications before your next appointment, please call your pharmacy*   Lab Work:   PLEASE GO DOWN STAIRS  [facility]  FIRST FLOOR : RETURN IN 3 MONTHS FASTING LIPID AND LIVER     If you have labs (blood work) drawn today and your tests are completely normal, you will receive your results only by: MyChart Message (if you have MyChart) OR A paper copy in the mail If you have any lab test that is abnormal or we need to change your treatment, we will call you to review the results.   Testing/Procedures: NONE ORDERED  TODAY    Follow-Up: At [facility], you and your health needs are our priority.  As part of our continuing mission to provide you with exceptional heart care, we have created designated Provider Care Teams.  These Care Teams include your primary Cardiologist (physician) and Advanced Practice Providers (APPs -  Physician Assistants and Nurse Practitioners) who all work together to provide you with the care you need, when you need it.  We recommend signing up for the patient portal called "MyChart".  Sign up information is provided on this After Visit Summary.  MyChart is used to connect with patients for Virtual Visits (Telemedicine).  Patients are able to view lab/test results, encounter notes, upcoming appointments, etc.  Non-urgent messages can be sent to your provider as well.   To learn more about what you can do with MyChart, go to [email or web address].    Your next appointment:    1 year(s)   Provider:  Lovette Rud / PA-C     Other Instructions

## 2023-11-07 ENCOUNTER — Other Ambulatory Visit: Payer: Self-pay | Admitting: Cardiovascular Disease

## 2023-11-07 DIAGNOSIS — R9431 Abnormal electrocardiogram [ECG] [EKG]: Secondary | ICD-10-CM

## 2023-12-11 ENCOUNTER — Other Ambulatory Visit: Payer: Self-pay | Admitting: Pharmacist

## 2023-12-14 ENCOUNTER — Telehealth: Payer: Self-pay | Admitting: Pharmacy Technician

## 2023-12-14 NOTE — Telephone Encounter (Addendum)
 Auth Submission: NO AUTH NEEDED Site of care: [facility] Payer: [employer] [place] Medication & CPT/J Code(s) submitted: [employer]  (Inclisiran) J1306 Route of submission (phone, fax, portal):  Phone #[phone] Fax # Auth type: Buy/Bill PB Units/visits requested: 284MG  Q3MONTHS X2 DOSES, THEN Q6MONTHS Reference number: [id number] 09/05/25. Ref: [id number] Approval from: 12/14/23 to 09/07/25    Co-pay card: [employer] aware

## 2023-12-22 ENCOUNTER — Ambulatory Visit

## 2023-12-22 VITALS — BP 144/80 | HR 64 | Temp 98.1°F | Resp 14 | Ht 71.75 in | Wt 208.0 lb

## 2023-12-22 DIAGNOSIS — E7849 Other hyperlipidemia: Secondary | ICD-10-CM | POA: Diagnosis not present

## 2023-12-22 MED ORDER — INCLISIRAN SODIUM 284 MG/1.5ML ~~LOC~~ SOSY
284.0000 mg | PREFILLED_SYRINGE | Freq: Once | SUBCUTANEOUS | Status: AC
Start: 2023-12-22 — End: 2023-12-22
  Administered 2023-12-22: 284 mg via SUBCUTANEOUS
  Filled 2023-12-22: qty 1.5

## 2023-12-22 NOTE — Progress Notes (Signed)
 Diagnosis: Hyperlipidemia  Provider:  Mannam, Praveen MD  Procedure: Injection  Leqvio (inclisiran), Dose: 284 mg, Site: subcutaneous, Number of injections: 1  Injection Site(s): Left arm  Post Care: Observation period completed  Discharge: Condition: Good, Destination: Home . AVS Declined  Performed by:  Shirly Dow, RN

## 2024-01-28 ENCOUNTER — Telehealth: Payer: Self-pay | Admitting: Cardiovascular Disease

## 2024-01-28 NOTE — Telephone Encounter (Signed)
 Max Spencer says someone called a few hours ago requesting an update on 5/13 claim status for Leq[employer] . She also asked if the office submits claims for reimbursement. Please advise.

## 2024-03-16 DIAGNOSIS — Z23 Encounter for immunization: Secondary | ICD-10-CM | POA: Diagnosis not present

## 2024-03-16 DIAGNOSIS — S91051A Open bite, right ankle, initial encounter: Secondary | ICD-10-CM | POA: Diagnosis not present

## 2024-03-16 DIAGNOSIS — W5911XA Bitten by nonvenomous snake, initial encounter: Secondary | ICD-10-CM | POA: Diagnosis not present

## 2024-03-28 ENCOUNTER — Ambulatory Visit (INDEPENDENT_AMBULATORY_CARE_PROVIDER_SITE_OTHER)

## 2024-03-28 VITALS — BP 158/91 | HR 84 | Temp 98.2°F | Resp 16 | Ht 71.75 in | Wt 205.6 lb

## 2024-03-28 DIAGNOSIS — E7849 Other hyperlipidemia: Secondary | ICD-10-CM | POA: Diagnosis not present

## 2024-03-28 MED ORDER — INCLISIRAN SODIUM 284 MG/1.5ML ~~LOC~~ SOSY
284.0000 mg | PREFILLED_SYRINGE | Freq: Once | SUBCUTANEOUS | Status: AC
  Administered 2024-03-28: 284 mg via SUBCUTANEOUS
  Filled 2024-03-28: qty 1.5

## 2024-03-28 NOTE — Progress Notes (Signed)
 Diagnosis: Hyperlipidemia  Provider:  Mannam, Praveen MD  Procedure: Injection  Leqvio  (inclisiran), Dose: 284 mg, Site: subcutaneous, Number of injections: 1  Injection Site(s): Left lower quad. abdomen  Post Care: Patient declined observation  Discharge: Condition: Good, Destination: Home . AVS Declined  Performed by:  Rocky FORBES Sar, RN

## 2024-05-05 DIAGNOSIS — B9689 Other specified bacterial agents as the cause of diseases classified elsewhere: Secondary | ICD-10-CM | POA: Diagnosis not present

## 2024-05-05 DIAGNOSIS — L0202 Furuncle of face: Secondary | ICD-10-CM | POA: Diagnosis not present

## 2024-05-30 DIAGNOSIS — E7800 Pure hypercholesterolemia, unspecified: Secondary | ICD-10-CM | POA: Diagnosis not present

## 2024-05-30 DIAGNOSIS — I10 Essential (primary) hypertension: Secondary | ICD-10-CM | POA: Diagnosis not present

## 2024-05-30 DIAGNOSIS — Z23 Encounter for immunization: Secondary | ICD-10-CM | POA: Diagnosis not present

## 2024-05-30 DIAGNOSIS — Z131 Encounter for screening for diabetes mellitus: Secondary | ICD-10-CM | POA: Diagnosis not present

## 2024-05-30 DIAGNOSIS — Z125 Encounter for screening for malignant neoplasm of prostate: Secondary | ICD-10-CM | POA: Diagnosis not present

## 2024-05-30 DIAGNOSIS — R002 Palpitations: Secondary | ICD-10-CM | POA: Diagnosis not present

## 2024-05-30 DIAGNOSIS — Z0000 Encounter for general adult medical examination without abnormal findings: Secondary | ICD-10-CM | POA: Diagnosis not present

## 2024-05-31 ENCOUNTER — Telehealth: Payer: Self-pay | Admitting: Pharmacy Technician

## 2024-05-31 NOTE — Telephone Encounter (Signed)
   Pharmacy Patient Advocate Encounter   Received notification from CoverMyMeds that prior authorization for VASCEPA  is required/requested.   Insurance verification completed.   The patient is insured through [facility] .   Per test claim: PA required; PA submitted to above mentioned insurance via Latent Key/confirmation #/EOC [id number] Status is pending   Pharmacy Patient Advocate Encounter  Received notification from [facility] that Prior Authorization for VASCEPA  has been APPROVED from 05/31/24 to 05/31/25   PA #/Case ID/Reference #: [id number]

## 2024-06-13 ENCOUNTER — Other Ambulatory Visit (HOSPITAL_COMMUNITY): Payer: Self-pay

## 2024-08-18 ENCOUNTER — Other Ambulatory Visit (HOSPITAL_BASED_OUTPATIENT_CLINIC_OR_DEPARTMENT_OTHER): Payer: Self-pay | Admitting: Family

## 2024-08-18 DIAGNOSIS — I10 Essential (primary) hypertension: Secondary | ICD-10-CM

## 2024-08-18 MED ORDER — AMLODIPINE BESYLATE 10 MG PO TABS: 10.0000 mg | ORAL_TABLET | Freq: Every day | ORAL | 0 refills | Status: AC

## 2024-09-07 ENCOUNTER — Other Ambulatory Visit: Payer: Self-pay | Admitting: Physician Assistant

## 2024-09-07 DIAGNOSIS — E785 Hyperlipidemia, unspecified: Secondary | ICD-10-CM

## 2024-09-09 MED ORDER — ROSUVASTATIN CALCIUM 10 MG PO TABS: 10.0000 mg | ORAL_TABLET | Freq: Every day | ORAL | 0 refills | Status: AC

## 2024-09-29 ENCOUNTER — Ambulatory Visit

## 2024-09-29 VITALS — BP 145/88 | HR 65 | Temp 98.0°F | Resp 12 | Ht 71.75 in | Wt 203.4 lb

## 2024-09-29 DIAGNOSIS — E785 Hyperlipidemia, unspecified: Secondary | ICD-10-CM

## 2024-09-29 DIAGNOSIS — E7849 Other hyperlipidemia: Secondary | ICD-10-CM | POA: Diagnosis not present

## 2024-09-29 DIAGNOSIS — E782 Mixed hyperlipidemia: Secondary | ICD-10-CM | POA: Diagnosis not present

## 2024-09-29 MED ORDER — INCLISIRAN SODIUM 284 MG/1.5ML ~~LOC~~ SOSY
284.0000 mg | PREFILLED_SYRINGE | Freq: Once | SUBCUTANEOUS | Status: DC
  Filled 2024-09-29: qty 1.5

## 2024-09-29 NOTE — Progress Notes (Signed)
 Diagnosis: Hyperlipidemia  Provider:  Mannam, Praveen MD  Procedure: Injection  Leqvio  (inclisiran), Dose: 284 mg, Site: subcutaneous, Number of injections: 1  Injection Site(s): Left lower quad. abdomen  Post Care: left lower quad abdominal injection  Discharge: Condition: Good, Destination: Home . AVS Declined  Performed by:  Maximiano JONELLE Pouch, LPN

## 2025-03-29 ENCOUNTER — Ambulatory Visit
